# Patient Record
Sex: Female | Born: 1980 | Race: White | Hispanic: No | Marital: Married | Smoking: Never smoker
Health system: Southern US, Community
[De-identification: ages and names within clinical notes are randomized; demographics above are authoritative.]

## PROBLEM LIST (undated history)

## (undated) DIAGNOSIS — O139 Gestational [pregnancy-induced] hypertension without significant proteinuria, unspecified trimester: Secondary | ICD-10-CM

## (undated) DIAGNOSIS — Z87898 Personal history of other specified conditions: Secondary | ICD-10-CM

## (undated) HISTORY — DX: Gestational (pregnancy-induced) hypertension without significant proteinuria, unspecified trimester: O13.9

---

## 2005-07-19 ENCOUNTER — Emergency Department (HOSPITAL_COMMUNITY): Admission: EM | Admit: 2005-07-19 | Discharge: 2005-07-19 | Payer: Self-pay | Admitting: Emergency Medicine

## 2006-05-29 ENCOUNTER — Emergency Department (HOSPITAL_COMMUNITY): Admission: EM | Admit: 2006-05-29 | Discharge: 2006-05-29 | Payer: Self-pay | Admitting: Family Medicine

## 2008-07-23 ENCOUNTER — Ambulatory Visit (HOSPITAL_COMMUNITY): Admission: RE | Admit: 2008-07-23 | Discharge: 2008-07-23 | Payer: Self-pay | Admitting: Obstetrics and Gynecology

## 2009-02-12 ENCOUNTER — Ambulatory Visit: Payer: Self-pay | Admitting: Internal Medicine

## 2009-03-31 ENCOUNTER — Ambulatory Visit: Payer: Self-pay | Admitting: Internal Medicine

## 2009-05-26 ENCOUNTER — Inpatient Hospital Stay (HOSPITAL_COMMUNITY): Admission: AD | Admit: 2009-05-26 | Discharge: 2009-05-28 | Payer: Self-pay | Admitting: Obstetrics and Gynecology

## 2010-05-02 NOTE — L&D Delivery Note (Signed)
Delivery Note At 8:39 AM a viable and healthy female was delivered via Vaginal, Spontaneous Delivery (Presentation: Right Occiput Anterior).  APGAR: 9, 9; weight .   Placenta status: Intact, Spontaneous.  Cord: 3VC with the following complications: None.  Cord pH: na  Anesthesia: Epidural  Episiotomy: None Lacerations: 2nd degree Suture Repair: 2.0 vicryl rapide Est. Blood Loss (mL): 300  Mom to postpartum.  Baby to nursery-stable.  Amiyah Shryock J 04/20/2011, 8:53 AM

## 2010-05-23 ENCOUNTER — Encounter: Payer: Self-pay | Admitting: Obstetrics & Gynecology

## 2010-07-18 LAB — CBC
HCT: 41.4 % (ref 36.0–46.0)
Hemoglobin: 13.8 g/dL (ref 12.0–15.0)
MCHC: 33.3 g/dL (ref 30.0–36.0)
MCV: 98.9 fL (ref 78.0–100.0)
MCV: 99.2 fL (ref 78.0–100.0)
Platelets: 179 10*3/uL (ref 150–400)
RBC: 4.17 MIL/uL (ref 3.87–5.11)
RDW: 13.8 % (ref 11.5–15.5)
WBC: 19.2 10*3/uL — ABNORMAL HIGH (ref 4.0–10.5)

## 2010-07-18 LAB — COMPREHENSIVE METABOLIC PANEL
AST: 26 U/L (ref 0–37)
BUN: 7 mg/dL (ref 6–23)
CO2: 23 mEq/L (ref 19–32)
Chloride: 103 mEq/L (ref 96–112)
Creatinine, Ser: 0.59 mg/dL (ref 0.4–1.2)
GFR calc non Af Amer: >60 mL/min (ref >60)
Total Bilirubin: 0.8 mg/dL (ref 0.3–1.2)

## 2010-07-18 LAB — URIC ACID: Uric Acid, Serum: 4.4 mg/dL (ref 2.4–7.0)

## 2010-09-22 LAB — ABO/RH: RH Type: POSITIVE

## 2010-09-22 LAB — VARICELLA ZOSTER ANTIBODY, IGG: Varicella: IMMUNE

## 2010-09-22 LAB — ANTIBODY SCREEN: Antibody Screen: NEGATIVE

## 2011-04-18 ENCOUNTER — Telehealth (HOSPITAL_COMMUNITY): Payer: Self-pay | Admitting: *Deleted

## 2011-04-18 ENCOUNTER — Encounter (HOSPITAL_COMMUNITY): Payer: Self-pay | Admitting: *Deleted

## 2011-04-18 NOTE — Telephone Encounter (Signed)
Preadmission screen  

## 2011-04-20 ENCOUNTER — Encounter (HOSPITAL_COMMUNITY): Payer: Self-pay | Admitting: *Deleted

## 2011-04-20 ENCOUNTER — Encounter (HOSPITAL_COMMUNITY): Payer: Self-pay | Admitting: Anesthesiology

## 2011-04-20 ENCOUNTER — Inpatient Hospital Stay (HOSPITAL_COMMUNITY): Payer: BC Managed Care – PPO | Admitting: Anesthesiology

## 2011-04-20 ENCOUNTER — Inpatient Hospital Stay (HOSPITAL_COMMUNITY)
Admission: AD | Admit: 2011-04-20 | Discharge: 2011-04-22 | DRG: 373 | Disposition: A | Discharge status: Home / Self Care | Payer: BC Managed Care – PPO | Source: Ambulatory Visit | Attending: Obstetrics | Admitting: Obstetrics

## 2011-04-20 DIAGNOSIS — O480 Post-term pregnancy: Secondary | ICD-10-CM | POA: Diagnosis present

## 2011-04-20 DIAGNOSIS — D62 Acute posthemorrhagic anemia: Secondary | ICD-10-CM | POA: Diagnosis not present

## 2011-04-20 DIAGNOSIS — Z22330 Carrier of Group B streptococcus: Secondary | ICD-10-CM

## 2011-04-20 DIAGNOSIS — O9903 Anemia complicating the puerperium: Secondary | ICD-10-CM | POA: Diagnosis not present

## 2011-04-20 DIAGNOSIS — O99892 Other specified diseases and conditions complicating childbirth: Secondary | ICD-10-CM | POA: Diagnosis present

## 2011-04-20 LAB — CBC
HCT: 38.1 % (ref 36.0–46.0)
Hemoglobin: 13 g/dL (ref 12.0–15.0)
MCH: 32.3 pg (ref 26.0–34.0)
MCHC: 34.1 g/dL (ref 30.0–36.0)
MCV: 94.8 fL (ref 78.0–100.0)

## 2011-04-20 LAB — COMPREHENSIVE METABOLIC PANEL
BUN: 14 mg/dL (ref 6–23)
CO2: 22 mEq/L (ref 19–32)
Calcium: 9.2 mg/dL (ref 8.4–10.5)
Creatinine, Ser: 0.66 mg/dL (ref 0.50–1.10)
GFR calc Af Amer: >90 mL/min (ref >90)
GFR calc non Af Amer: >90 mL/min (ref >90)
Glucose, Bld: 95 mg/dL (ref 70–99)

## 2011-04-20 LAB — URIC ACID: Uric Acid, Serum: 4.4 mg/dL (ref 2.4–7.0)

## 2011-04-20 LAB — RPR: RPR Ser Ql: NONREACTIVE

## 2011-04-20 LAB — LACTATE DEHYDROGENASE: LDH: 223 U/L (ref 94–250)

## 2011-04-20 MED ORDER — EPHEDRINE 5 MG/ML INJ: 10.0000 mg | INTRAVENOUS | Status: DC | PRN

## 2011-04-20 MED ORDER — DIPHENHYDRAMINE HCL 50 MG/ML IJ SOLN: 12.5000 mg | INTRAMUSCULAR | Status: DC | PRN

## 2011-04-20 MED ORDER — DIBUCAINE 1 % RE OINT: 1.0000 "application " | TOPICAL_OINTMENT | RECTAL | Status: DC | PRN

## 2011-04-20 MED ORDER — ACETAMINOPHEN 325 MG PO TABS: 650.0000 mg | ORAL_TABLET | ORAL | Status: DC | PRN

## 2011-04-20 MED ORDER — TETANUS-DIPHTH-ACELL PERTUSSIS 5-2.5-18.5 LF-MCG/0.5 IM SUSP
0.5000 mL | Freq: Once | INTRAMUSCULAR | Status: AC
  Administered 2011-04-21: 0.5 mL via INTRAMUSCULAR
  Filled 2011-04-20: qty 0.5

## 2011-04-20 MED ORDER — BENZOCAINE-MENTHOL 20-0.5 % EX AERO: 1.0000 "application " | INHALATION_SPRAY | CUTANEOUS | Status: DC | PRN

## 2011-04-20 MED ORDER — FENTANYL 2.5 MCG/ML BUPIVACAINE 1/10 % EPIDURAL INFUSION (WH - ANES)
INTRAMUSCULAR | Status: DC | PRN
  Administered 2011-04-20: 14 mL/h via EPIDURAL

## 2011-04-20 MED ORDER — FLEET ENEMA 7-19 GM/118ML RE ENEM: 1.0000 | ENEMA | RECTAL | Status: DC | PRN

## 2011-04-20 MED ORDER — ONDANSETRON HCL 4 MG/2ML IJ SOLN
4.0000 mg | Freq: Four times a day (QID) | INTRAMUSCULAR | Status: DC | PRN
  Administered 2011-04-20: 4 mg via INTRAVENOUS
  Filled 2011-04-20: qty 2

## 2011-04-20 MED ORDER — ONDANSETRON HCL 4 MG/2ML IJ SOLN
4.0000 mg | INTRAMUSCULAR | Status: DC | PRN
  Filled 2011-04-20: qty 2

## 2011-04-20 MED ORDER — WITCH HAZEL-GLYCERIN EX PADS: 1.0000 "application " | MEDICATED_PAD | CUTANEOUS | Status: DC | PRN

## 2011-04-20 MED ORDER — PHENYLEPHRINE 40 MCG/ML (10ML) SYRINGE FOR IV PUSH (FOR BLOOD PRESSURE SUPPORT)
80.0000 ug | PREFILLED_SYRINGE | INTRAVENOUS | Status: DC | PRN
  Filled 2011-04-20: qty 5

## 2011-04-20 MED ORDER — METHYLERGONOVINE MALEATE 0.2 MG/ML IJ SOLN: 0.2000 mg | INTRAMUSCULAR | Status: DC | PRN

## 2011-04-20 MED ORDER — BENZOCAINE-MENTHOL 20-0.5 % EX AERO
INHALATION_SPRAY | CUTANEOUS | Status: AC
  Filled 2011-04-20: qty 56

## 2011-04-20 MED ORDER — LACTATED RINGERS IV SOLN: 500.0000 mL | Freq: Once | INTRAVENOUS | Status: DC

## 2011-04-20 MED ORDER — EPHEDRINE 5 MG/ML INJ
10.0000 mg | INTRAVENOUS | Status: DC | PRN
  Filled 2011-04-20: qty 4

## 2011-04-20 MED ORDER — METHYLERGONOVINE MALEATE 0.2 MG PO TABS: 0.2000 mg | ORAL_TABLET | ORAL | Status: DC | PRN

## 2011-04-20 MED ORDER — CITRIC ACID-SODIUM CITRATE 334-500 MG/5ML PO SOLN: 30.0000 mL | ORAL | Status: DC | PRN

## 2011-04-20 MED ORDER — DIPHENHYDRAMINE HCL 25 MG PO CAPS: 25.0000 mg | ORAL_CAPSULE | Freq: Four times a day (QID) | ORAL | Status: DC | PRN

## 2011-04-20 MED ORDER — LANOLIN HYDROUS EX OINT: TOPICAL_OINTMENT | CUTANEOUS | Status: DC | PRN

## 2011-04-20 MED ORDER — PRENATAL MULTIVITAMIN CH
1.0000 | ORAL_TABLET | Freq: Every day | ORAL | Status: DC
  Administered 2011-04-21 – 2011-04-22 (×2): 1 via ORAL
  Filled 2011-04-20 (×2): qty 1

## 2011-04-20 MED ORDER — FENTANYL 2.5 MCG/ML BUPIVACAINE 1/10 % EPIDURAL INFUSION (WH - ANES)
14.0000 mL/h | INTRAMUSCULAR | Status: DC
  Filled 2011-04-20: qty 60

## 2011-04-20 MED ORDER — LACTATED RINGERS IV SOLN
INTRAVENOUS | Status: DC
  Administered 2011-04-20: 05:00:00 via INTRAVENOUS

## 2011-04-20 MED ORDER — LIDOCAINE HCL 1.5 % IJ SOLN
INTRAMUSCULAR | Status: DC | PRN
  Administered 2011-04-20 (×2): 5 mL via EPIDURAL

## 2011-04-20 MED ORDER — OXYTOCIN 20 UNITS IN LACTATED RINGERS INFUSION - SIMPLE: 125.0000 mL/h | INTRAVENOUS | Status: DC

## 2011-04-20 MED ORDER — OXYTOCIN BOLUS FROM INFUSION
500.0000 mL | Freq: Once | INTRAVENOUS | Status: DC
  Filled 2011-04-20: qty 500
  Filled 2011-04-20: qty 1000

## 2011-04-20 MED ORDER — LACTATED RINGERS IV SOLN: 500.0000 mL | INTRAVENOUS | Status: DC | PRN

## 2011-04-20 MED ORDER — OXYCODONE-ACETAMINOPHEN 5-325 MG PO TABS: 2.0000 | ORAL_TABLET | ORAL | Status: DC | PRN

## 2011-04-20 MED ORDER — IBUPROFEN 600 MG PO TABS
600.0000 mg | ORAL_TABLET | Freq: Four times a day (QID) | ORAL | Status: DC
  Administered 2011-04-20 – 2011-04-22 (×8): 600 mg via ORAL
  Filled 2011-04-20 (×8): qty 1

## 2011-04-20 MED ORDER — LIDOCAINE HCL (PF) 1 % IJ SOLN
30.0000 mL | INTRAMUSCULAR | Status: DC | PRN
  Filled 2011-04-20: qty 30

## 2011-04-20 MED ORDER — PHENYLEPHRINE 40 MCG/ML (10ML) SYRINGE FOR IV PUSH (FOR BLOOD PRESSURE SUPPORT): 80.0000 ug | PREFILLED_SYRINGE | INTRAVENOUS | Status: DC | PRN

## 2011-04-20 MED ORDER — SENNOSIDES-DOCUSATE SODIUM 8.6-50 MG PO TABS
2.0000 | ORAL_TABLET | Freq: Every day | ORAL | Status: DC
  Administered 2011-04-20 – 2011-04-21 (×2): 2 via ORAL

## 2011-04-20 MED ORDER — ONDANSETRON HCL 4 MG PO TABS: 4.0000 mg | ORAL_TABLET | ORAL | Status: DC | PRN

## 2011-04-20 MED ORDER — IBUPROFEN 600 MG PO TABS: 600.0000 mg | ORAL_TABLET | Freq: Four times a day (QID) | ORAL | Status: DC | PRN

## 2011-04-20 MED ORDER — OXYTOCIN 20 UNITS IN LACTATED RINGERS INFUSION - SIMPLE
125.0000 mL/h | Freq: Once | INTRAVENOUS | Status: AC
  Administered 2011-04-20: 999 mL/h via INTRAVENOUS

## 2011-04-20 MED ORDER — SODIUM CHLORIDE 0.9 % IV SOLN
2.0000 g | Freq: Once | INTRAVENOUS | Status: AC
  Administered 2011-04-20: 2 g via INTRAVENOUS
  Filled 2011-04-20: qty 2000

## 2011-04-20 MED ORDER — SIMETHICONE 80 MG PO CHEW: 80.0000 mg | CHEWABLE_TABLET | ORAL | Status: DC | PRN

## 2011-04-20 MED ORDER — OXYCODONE-ACETAMINOPHEN 5-325 MG PO TABS: 1.0000 | ORAL_TABLET | ORAL | Status: DC | PRN

## 2011-04-20 MED ORDER — ZOLPIDEM TARTRATE 5 MG PO TABS: 5.0000 mg | ORAL_TABLET | Freq: Every evening | ORAL | Status: DC | PRN

## 2011-04-20 NOTE — Anesthesia Procedure Notes (Signed)
Epidural Patient location during procedure: OB Start time: 04/20/2011 6:19 AM End time: 04/20/2011 6:24 AM Reason for block: procedure for pain  Staffing Anesthesiologist: Sandrea Hughs  Preanesthetic Checklist Completed: patient identified, site marked, surgical consent, pre-op evaluation, timeout performed, IV checked, risks and benefits discussed and monitors and equipment checked  Epidural Patient position: sitting Prep: site prepped and draped and DuraPrep Patient monitoring: continuous pulse ox and blood pressure Approach: midline Injection technique: LOR air  Needle:  Needle type: Tuohy  Needle gauge: 17 G Needle length: 9 cm Needle insertion depth: 5 cm cm Catheter type: closed end flexible Catheter size: 19 Gauge Catheter at skin depth: 10 cm Test dose: negative and 1.5% lidocaine  Assessment Sensory level: T8 Events: blood not aspirated, injection not painful, no injection resistance, negative IV test and no paresthesia

## 2011-04-20 NOTE — Anesthesia Postprocedure Evaluation (Signed)
  Anesthesia Post Note  Patient: Mia Nguyen  Procedure(s) Performed: * No procedures listed *  Anesthesia type: Epidural  Patient location: Mother/Baby  Post pain: Pain level controlled  Post assessment: Post-op Vital signs reviewed  Last Vitals:  Filed Vitals:   04/20/11 1500  BP: 108/69  Pulse: 109  Temp: 36.9 C  Resp: 16    Post vital signs: Reviewed  Level of consciousness:alert  Complications: No apparent anesthesia complications

## 2011-04-20 NOTE — Progress Notes (Signed)
Contractions  

## 2011-04-20 NOTE — Progress Notes (Signed)
Nursing note:  Dr. Ernestina Penna notified of patient in [address] having heavy bleeding and large clots upon fundal massage at 1310. Dr. Ernestina Penna notified of blood pressure after fundal massage of 91\65 and pluse of 96.Dr. Ernestina Penna was also notified of Blood pressure before the blood clots of 114\80 pulse 120. No new orders received at that time. We will monitor patient. Patient has voided 200cc via bedpan. Fundal massage done after Patient voided and there were no blood clots. Patient was firm and 2 below umbilicus. Patient has good color and is resting comfortably now. Report given to RN Limmie Patricia. We will continue to monitor patient.

## 2011-04-20 NOTE — Progress Notes (Signed)
Pt may go to room 174. 

## 2011-04-20 NOTE — Addendum Note (Signed)
Addendum  created 04/20/11 1712 by Cephus Shelling   Modules edited:Charges VN, Notes Section

## 2011-04-20 NOTE — Progress Notes (Signed)
Pt complained of syncope and not feeling good. I did a fundal massage and pt had many large golf ball like clots. Lochia heavy. Fundus was down 2 and firm no more clots noted. Bladder scan was 317cc. Patient was cleaned up and left to rest.

## 2011-04-20 NOTE — Progress Notes (Signed)
Mia Nguyen is a 30 y.o. G2P1001 at [redacted]w[redacted]d by LMP admitted for active labor  Subjective: Feels pressure  Objective: BP 134/74  Pulse 68  Temp(Src) 97.5 F (36.4 C) (Oral)  Resp 20  Ht 5\' 4"  (1.626 m)  Wt 63.504 kg (140 lb)  BMI 24.03 kg/m2  SpO2 98%  LMP 07/08/2010      FHT:  FHR: 155 bpm, variability: moderate,  accelerations:  Present,  decelerations:  Absent UC:   regular, every 3-4 minutes SVE:   Dilation: 10 Effacement (%): 100 Station: 0 Exam by:: Johanan Skorupski AROM- clear  Labs: Lab Results  Component Value Date   WBC 14.1* 04/20/2011   HGB 13.0 04/20/2011   HCT 38.1 04/20/2011   MCV 94.8 04/20/2011   PLT 178 04/20/2011    Assessment / Plan: Spontaneous labor, progressing normally GBS positive received Ampicillin Elevated BP, nl labs, no s/s of PEC  Labor: Progressing normally Preeclampsia:  no signs or symptoms of toxicity, intake and ouput balanced and labs stable Fetal Wellbeing:  Category I Pain Control:  Epidural I/D:  n/a Anticipated MOD:  NSVD  Kwame Ryland J 04/20/2011, 8:13 AM

## 2011-04-20 NOTE — Addendum Note (Signed)
Addendum  created 04/20/11 1712 by Marty Sadlowski   Modules edited:Charges VN, Notes Section    

## 2011-04-20 NOTE — Anesthesia Postprocedure Evaluation (Signed)
Anesthesia Post Note  Patient: Mia Nguyen  Procedure(s) Performed: * No procedures listed *  Anesthesia type: Epidural  Patient location: Mother/Baby  Post pain: Pain level controlled  Post assessment: Post-op Vital signs reviewed  Last Vitals:  Filed Vitals:   04/20/11 0916  BP: 144/83  Pulse: 65  Temp:   Resp: 20    Post vital signs: Reviewed  Level of consciousness: awake  Complications: No apparent anesthesia complications

## 2011-04-20 NOTE — Progress Notes (Signed)
Dr. Billy Coast called back to add on [facility] labs.

## 2011-04-20 NOTE — Progress Notes (Signed)
No new clots noted or oozing. Pt color was good. Pt resting comfortably. Fundus firm and 2 below umbilicus. Will monitor bleeding.

## 2011-04-20 NOTE — Progress Notes (Signed)
Notified of pt presenting for labor check.  Notified of VE and ctx pattern.  Notified of elevated BP.  Admit orders received.

## 2011-04-20 NOTE — Anesthesia Preprocedure Evaluation (Signed)
Anesthesia Evaluation  Patient identified by MRN, date of birth, ID band Patient awake    Reviewed: Allergy & Precautions, H&P , NPO status , Patient's Chart, lab work & pertinent test results  Airway Mallampati: I TM Distance: >3 FB Neck ROM: full    Dental No notable dental hx.    Pulmonary neg pulmonary ROS,    Pulmonary exam normal       Cardiovascular     Neuro/Psych Negative Neurological ROS  Negative Psych ROS   GI/Hepatic negative GI ROS, Neg liver ROS,   Endo/Other  Negative Endocrine ROS  Renal/GU negative Renal ROS  Genitourinary negative   Musculoskeletal negative musculoskeletal ROS (+)   Abdominal Normal abdominal exam  (+)   Peds negative pediatric ROS (+)  Hematology negative hematology ROS (+)   Anesthesia Other Findings   Reproductive/Obstetrics (+) Pregnancy                           Anesthesia Physical Anesthesia Plan  ASA: II  Anesthesia Plan: Epidural   Post-op Pain Management:    Induction:   Airway Management Planned:   Additional Equipment:   Intra-op Plan:   Post-operative Plan:   Informed Consent: I have reviewed the patients History and Physical, chart, labs and discussed the procedure including the risks, benefits and alternatives for the proposed anesthesia with the patient or authorized representative who has indicated his/her understanding and acceptance.     Plan Discussed with:   Anesthesia Plan Comments:         Anesthesia Quick Evaluation  

## 2011-04-21 LAB — CBC
HCT: 23.2 % — ABNORMAL LOW (ref 36.0–46.0)
Hemoglobin: 7.8 g/dL — ABNORMAL LOW (ref 12.0–15.0)
MCHC: 33.6 g/dL (ref 30.0–36.0)
WBC: 13.9 10*3/uL — ABNORMAL HIGH (ref 4.0–10.5)

## 2011-04-21 MED ORDER — POLYSACCHARIDE IRON 150 MG PO CAPS
150.0000 mg | ORAL_CAPSULE | Freq: Two times a day (BID) | ORAL | Status: DC
  Administered 2011-04-21 – 2011-04-22 (×3): 150 mg via ORAL
  Filled 2011-04-21 (×3): qty 1

## 2011-04-21 MED ORDER — DOCUSATE SODIUM 100 MG PO CAPS
100.0000 mg | ORAL_CAPSULE | Freq: Two times a day (BID) | ORAL | Status: DC
  Administered 2011-04-21 – 2011-04-22 (×3): 100 mg via ORAL
  Filled 2011-04-21 (×3): qty 1

## 2011-04-21 NOTE — H&P (Signed)
Mia Nguyen, Mia Nguyen                   ACCOUNT NO.:  [id number]  MEDICAL RECORD NO.:  [id number]  LOCATION:  9112                          FACILITY:  [facility]  PHYSICIAN:  Lenoard Aden, M.D.DATE OF BIRTH:  [birth year]  DATE OF ADMISSION:  04/20/2011 DATE OF DISCHARGE:                             HISTORY & PHYSICAL   CHIEF COMPLAINT:  Labor.  HISTORY OF PRESENT ILLNESS:  She is a 30 year old white female, G2, P1, at 23 weeks' and 6 days gestation who presents for active labor.  She reports contractions every 4 minutes.  She has allergies to SULFA.  Medications include prenatal vitamins.  She is a nonsmoker, nondrinker.  She denies domestic or physical violence.  She has a family history of stroke, hypertension, prostate, and breast cancer.  She has a previous history of an uncomplicated vaginal delivery.  Prenatal course complicated by group B strep positivity and postdate status.  PHYSICAL EXAMINATION:  GENERAL:  She is a well-developed, well- nourished, white female, in no acute distress. HEENT:  Normal. NECK:  Supple.  Full range of motion. LUNGS:  Clear. HEART:  Regular rhythm. ABDOMEN:  Soft, gravid, nontender.  Estimated fetal weight 7.5-8 pounds. Cervix is 9-10 cm, 100% vertex, 0 station. EXTREMITIES:  There are no cords. NEUROLOGIC:  Nonfocal. SKIN:  Intact.  IMPRESSION: 1. Term intrauterine pregnancy in active labor. 2. GBS positive.  PLAN:  Ampicillin prophylaxis, epidural anticipate attempts at vaginal delivery.     Lenoard Aden, M.D.     RJT/MEDQ  D:  04/20/2011  T:  04/21/2011  Job:  [id number]

## 2011-04-21 NOTE — Progress Notes (Signed)
PPD 1 SVD  S:  Reports feeling well, slightly tired.             Tolerating po/ No nausea or vomiting             Bleeding is light now, episode of heavy bleeding and clots yesterday, resolved after fundal massage.             Pain controlled withmotrin             Up ad lib / ambulatory, showered this AM, denies dizziness/SOB/CP  Newborn  Information for the patient's newborn:  Baylynn, Shifflett [[id number]]  female  breast feeding    O:  A & O x 3 NAD             VS: Blood pressure 105/65, pulse 77, temperature 97.7 F (36.5 C), temperature source Oral, resp. rate 18, height 5\' 4"  (1.626 m), weight 63.504 kg (140 lb), last menstrual period 07/08/2010, SpO2 98.00%, unknown if currently breastfeeding.  LABS:  Basename 04/21/11 0520 04/20/11 0520  HGB 7.8* 13.0  HCT 23.2* 38.1    I&O: I/O last 3 completed shifts: In: -  Out: 900 [Urine:600; Blood:300]      Lungs: Clear and unlabored  Heart: regular rate and rhythm / no mumurs  Abdomen: soft, non-tender, non-distended              Fundus: firm, non-tender, U-1  Perineum: intact repair, no edema  Lochia: scant  Extremities: no edema, no calf pain or tenderness, neg Homans    A/P: PPD # 1 30 y.o., [id number] S/P:spontaneous vaginal   Active Problems:  Postpartum care following vaginal delivery (12/19) PP hemorrhage / uterine atony - resolved  Acute Blood loss anemia - asymptomatic   Routine PP orders Start oral Fe and Colace Anticipate discharge home in AM.    Klinton Candelas, CNM, MSN 04/21/2011, 10:03 AM

## 2011-04-22 ENCOUNTER — Inpatient Hospital Stay (HOSPITAL_COMMUNITY): Admission: RE | Admit: 2011-04-22 | Payer: Self-pay | Source: Ambulatory Visit

## 2011-04-22 MED ORDER — IBUPROFEN 600 MG PO TABS: 600.0000 mg | ORAL_TABLET | Freq: Four times a day (QID) | ORAL | Status: AC

## 2011-04-22 MED ORDER — OXYCODONE-ACETAMINOPHEN 5-325 MG PO TABS
1.0000 | ORAL_TABLET | Freq: Four times a day (QID) | ORAL | Status: AC | PRN
Start: 2011-04-22 — End: 2011-05-02

## 2011-04-22 MED ORDER — POLYSACCHARIDE IRON 150 MG PO CAPS: 150.0000 mg | ORAL_CAPSULE | Freq: Two times a day (BID) | ORAL | Status: DC

## 2011-04-22 NOTE — Progress Notes (Signed)
Patient ID: Mia Nguyen, female   DOB: 04-20-1981, 30 y.o.   MRN: [id number] PPD # 2  Subjective: Pt reports feeling well and eager for d/c home/ Pain controlled with prescription NSAID's including motrin and narcotic analgesics including percocet Tolerating po/ Voiding without problems/ No n/v Bleeding is light/ Newborn info:  Information for the patient's newborn:  Ming, Kunka [[id number]]  female  Feeding: breast    Objective:  VS: Blood pressure 126/75, pulse 88, temperature 97.8 F (36.6 C), temperature source Oral, resp. rate 18, height 5\' 4"  (1.626 m), weight 63.504 kg (140 lb), last menstrual period 07/08/2010, SpO2 98.00%, unknown if currently breastfeeding.    Basename 04/21/11 0520 04/20/11 0520  WBC 13.9* 14.1*  HGB 7.8* 13.0  HCT 23.2* 38.1  PLT 135* 178    Blood type: B/Positive/-- (05/23 0000) Rubella:      Physical Exam:  General: alert, cooperative and no distress Abdomen: soft, nontender, normal bowel sounds Uterine Fundus: firm, below umbilicus, nontender Perineum: not inspected Lochia: minimal Ext: Homans sign is negative, no sign of DVT and no edema, redness or tenderness in the calves or thighs   A/P: PPD # 2/ [id number] ABL Anemia Doing well and stable for discharge home RX: Ibuprofen 600mg  po Q 6 hrs prn pain #30 Refill x 1 Niferex 150mg  po BID #60 Ref x 2 Percocet 5/325 1 to 2 po Q 4 hrs prn pain #15 No refill WOB/GYN booklet given Routine pp visit in 6wks  Signed: Arlana Lindau, [facility] 04/22/11 1000

## 2011-04-22 NOTE — Discharge Summary (Signed)
Obstetric Discharge Summary Reason for Admission: onset of labor and Term Gestation at 40 wks 6 days Prenatal Procedures: NST and ultrasound Intrapartum Procedures: spontaneous vaginal delivery and 2nd degree repair Postpartum Procedures: none Complications-Operative and Postpartum: none Hemoglobin  Date Value Range Status  04/21/2011 7.8* 12.0-15.0 (g/dL) Final     DELTA CHECK NOTED     REPEATED TO VERIFY     HCT  Date Value Range Status  04/21/2011 23.2* 36.0-46.0 (%) Final    Discharge Diagnoses: Term Pregnancy-delivered and ABL anemia  Discharge Information: Date: 04/22/2011 Activity: pelvic rest Diet: routine Medications: Ibuprofen and Iron Condition: stable Instructions: refer to practice specific booklet Discharge to: home   Newborn Data: Live born female on 04/20/11 Birth Weight: 7 lb 8.1 oz (3405 g) APGAR: 9, 9  Home with mother.  Mia Nguyen 04/22/2011, 9:32 AM

## 2012-04-27 ENCOUNTER — Telehealth: Payer: Self-pay | Admitting: Internal Medicine

## 2012-04-27 NOTE — Telephone Encounter (Signed)
Patient has only been seen here once October 2010. Requesting Tamiflu for flu exposure. Does not have fever or shaking chills only slight cough. [place] is in [place]. 4 family members apparently had flu symptoms. She is asking that Tamiflu be called in for her. We have declined to do this. She will not return home and will December 30. We can see her then or she can go to urgent care. We would not prescribe Tamiflu and that she had true flulike symptoms such as  fever, chills, myalgias

## 2012-04-30 ENCOUNTER — Encounter: Payer: Self-pay | Admitting: Internal Medicine

## 2012-04-30 ENCOUNTER — Ambulatory Visit (INDEPENDENT_AMBULATORY_CARE_PROVIDER_SITE_OTHER): Payer: BC Managed Care – PPO | Admitting: Internal Medicine

## 2012-04-30 VITALS — BP 114/80 | Temp 98.4°F | Ht 63.0 in | Wt 120.0 lb

## 2012-04-30 DIAGNOSIS — H6593 Unspecified nonsuppurative otitis media, bilateral: Secondary | ICD-10-CM

## 2012-04-30 DIAGNOSIS — B349 Viral infection, unspecified: Secondary | ICD-10-CM

## 2012-04-30 DIAGNOSIS — Z23 Encounter for immunization: Secondary | ICD-10-CM

## 2012-04-30 DIAGNOSIS — J029 Acute pharyngitis, unspecified: Secondary | ICD-10-CM

## 2012-04-30 DIAGNOSIS — B9789 Other viral agents as the cause of diseases classified elsewhere: Secondary | ICD-10-CM

## 2012-04-30 DIAGNOSIS — J40 Bronchitis, not specified as acute or chronic: Secondary | ICD-10-CM

## 2012-04-30 DIAGNOSIS — H6590 Unspecified nonsuppurative otitis media, unspecified ear: Secondary | ICD-10-CM

## 2012-04-30 NOTE — Patient Instructions (Addendum)
Influenza immunization given today. Take Zithromax Z-PAK as corrected. Take Tessalon Perles as needed for cough. Call if not better in one week. Tylenol if needed for myalgias or fever. Consider yourself contagious for the next 48 hours.

## 2012-04-30 NOTE — Progress Notes (Signed)
  Subjective:    Patient ID: Mia Nguyen, female    DOB: [birth year], 31 y.o.   MRN: [id number]  HPI 31 year old white female not seen in 4 years in today with onset of febrile illness on Friday, December 27 while out of town. Other family members had similar illness. She had myalgias, developed a scratchy throat with cough. Cough is now productive of discolored sputum. Her ears feel full and stuffy particularly the left one. She had a couple of episodes of vomiting but no diarrhea. Did have some chills.    Review of Systems     Objective:   Physical Exam HEENT exam: Both TMs are full but the left one is fuller than the right one. TMs are not red. Pharynx is red. Rapid strep screen is negative. Neck is supple. Chest clear to auscultation.        Assessment & Plan:  Bilateral serous otitis media  Pharyngitis  Bronchitis  Viral syndrome  Plan: Explained to patient is too late to start Tamiflu. She did not want to have flu PCR testing. Zithromax Z-PAK take 2 tablets day one followed by 1 tablet days 2 through 5. Call if not better in one week. Tessalon Perles 100 mg per and sees #60) 2 by mouth 3 times a day when necessary cough with one refill. Influenza immunization given today.

## 2013-01-03 LAB — OB RESULTS CONSOLE HIV ANTIBODY (ROUTINE TESTING): HIV: NONREACTIVE

## 2013-01-03 LAB — OB RESULTS CONSOLE ANTIBODY SCREEN: ANTIBODY SCREEN: NEGATIVE

## 2013-01-03 LAB — OB RESULTS CONSOLE RUBELLA ANTIBODY, IGM: Rubella: IMMUNE

## 2013-01-03 LAB — OB RESULTS CONSOLE HEPATITIS B SURFACE ANTIGEN: Hepatitis B Surface Ag: NEGATIVE

## 2013-01-03 LAB — OB RESULTS CONSOLE ABO/RH: RH TYPE: POSITIVE

## 2013-01-03 LAB — OB RESULTS CONSOLE RPR: RPR: NONREACTIVE

## 2013-01-10 LAB — OB RESULTS CONSOLE GC/CHLAMYDIA
CHLAMYDIA, DNA PROBE: NEGATIVE
GC PROBE AMP, GENITAL: NEGATIVE

## 2013-04-11 DIAGNOSIS — O09899 Supervision of other high risk pregnancies, unspecified trimester: Secondary | ICD-10-CM

## 2013-04-11 HISTORY — DX: Supervision of other high risk pregnancies, unspecified trimester: O09.899

## 2013-05-02 NOTE — L&D Delivery Note (Signed)
Delivery Note At 3:57 PM a viable and healthy female was delivered via  (Presentation: LOA  ).  APGAR: 9,9 ; weight pending.   Placenta status: spontaneous, intact .  Cord:  with the following complications: none.  Cord pH: na Placenta to pathology  Anesthesia:  epidural Episiotomy: none Lacerations: second Suture Repair: 2.0 vicryl rapide Est. Blood Loss (mL): 200  Mom to postpartum.  Baby to Couplet care / Skin to Skin.  Elizabth Palka J 07/29/2013, 4:12 PM

## 2013-05-09 ENCOUNTER — Inpatient Hospital Stay (HOSPITAL_COMMUNITY)
Admission: AD | Admit: 2013-05-09 | Payer: BC Managed Care – PPO | Source: Ambulatory Visit | Admitting: Obstetrics and Gynecology

## 2013-07-01 LAB — OB RESULTS CONSOLE GBS: GBS: NEGATIVE

## 2013-07-16 ENCOUNTER — Other Ambulatory Visit: Payer: Self-pay | Admitting: Obstetrics and Gynecology

## 2013-07-24 ENCOUNTER — Encounter (HOSPITAL_COMMUNITY): Payer: Self-pay | Admitting: *Deleted

## 2013-07-24 ENCOUNTER — Telehealth (HOSPITAL_COMMUNITY): Payer: Self-pay | Admitting: *Deleted

## 2013-07-24 NOTE — Telephone Encounter (Signed)
Preadmission screen  

## 2013-07-29 ENCOUNTER — Encounter (HOSPITAL_COMMUNITY): Payer: BC Managed Care – PPO | Admitting: Anesthesiology

## 2013-07-29 ENCOUNTER — Inpatient Hospital Stay (HOSPITAL_COMMUNITY)
Admission: RE | Admit: 2013-07-29 | Discharge: 2013-07-31 | DRG: 774 | Disposition: A | Discharge status: Home / Self Care | Payer: BC Managed Care – PPO | Source: Ambulatory Visit | Attending: Obstetrics and Gynecology | Admitting: Obstetrics and Gynecology

## 2013-07-29 ENCOUNTER — Encounter (HOSPITAL_COMMUNITY): Payer: Self-pay

## 2013-07-29 ENCOUNTER — Inpatient Hospital Stay (HOSPITAL_COMMUNITY): Payer: BC Managed Care – PPO | Admitting: Anesthesiology

## 2013-07-29 DIAGNOSIS — K219 Gastro-esophageal reflux disease without esophagitis: Secondary | ICD-10-CM | POA: Diagnosis present

## 2013-07-29 DIAGNOSIS — Z823 Family history of stroke: Secondary | ICD-10-CM

## 2013-07-29 DIAGNOSIS — O1002 Pre-existing essential hypertension complicating childbirth: Secondary | ICD-10-CM | POA: Diagnosis present

## 2013-07-29 DIAGNOSIS — Z87898 Personal history of other specified conditions: Secondary | ICD-10-CM

## 2013-07-29 HISTORY — DX: Personal history of other specified conditions: Z87.898

## 2013-07-29 LAB — CBC
HEMATOCRIT: 38.7 % (ref 36.0–46.0)
HEMOGLOBIN: 13.4 g/dL (ref 12.0–15.0)
MCH: 32.6 pg (ref 26.0–34.0)
MCHC: 34.6 g/dL (ref 30.0–36.0)
MCV: 94.2 fL (ref 78.0–100.0)
Platelets: 201 10*3/uL (ref 150–400)
RBC: 4.11 MIL/uL (ref 3.87–5.11)
RDW: 13.1 % (ref 11.5–15.5)
WBC: 11.5 10*3/uL — ABNORMAL HIGH (ref 4.0–10.5)

## 2013-07-29 LAB — COMPREHENSIVE METABOLIC PANEL
ALBUMIN: 2.7 g/dL — AB (ref 3.5–5.2)
ALT: 12 U/L (ref 0–35)
AST: 20 U/L (ref 0–37)
Alkaline Phosphatase: 124 U/L — ABNORMAL HIGH (ref 39–117)
BUN: 14 mg/dL (ref 6–23)
CALCIUM: 9.4 mg/dL (ref 8.4–10.5)
CHLORIDE: 104 meq/L (ref 96–112)
CO2: 22 mEq/L (ref 19–32)
CREATININE: 0.54 mg/dL (ref 0.50–1.10)
GFR calc Af Amer: >90 mL/min (ref >90)
GFR calc non Af Amer: >90 mL/min (ref >90)
Glucose, Bld: 95 mg/dL (ref 70–99)
Potassium: 4.2 mEq/L (ref 3.7–5.3)
Sodium: 137 mEq/L (ref 137–147)
TOTAL PROTEIN: 5.8 g/dL — AB (ref 6.0–8.3)
Total Bilirubin: 0.3 mg/dL (ref 0.3–1.2)

## 2013-07-29 LAB — TYPE AND SCREEN
ABO/RH(D): B POS
ANTIBODY SCREEN: NEGATIVE

## 2013-07-29 LAB — ABO/RH: ABO/RH(D): B POS

## 2013-07-29 LAB — URIC ACID: URIC ACID, SERUM: 3.9 mg/dL (ref 2.4–7.0)

## 2013-07-29 LAB — RPR: RPR Ser Ql: NONREACTIVE

## 2013-07-29 MED ORDER — PHENYLEPHRINE 40 MCG/ML (10ML) SYRINGE FOR IV PUSH (FOR BLOOD PRESSURE SUPPORT)
PREFILLED_SYRINGE | INTRAVENOUS | Status: AC
  Filled 2013-07-29: qty 10

## 2013-07-29 MED ORDER — FENTANYL 2.5 MCG/ML BUPIVACAINE 1/10 % EPIDURAL INFUSION (WH - ANES)
INTRAMUSCULAR | Status: DC | PRN
  Administered 2013-07-29: 13 mL/h via EPIDURAL

## 2013-07-29 MED ORDER — IBUPROFEN 600 MG PO TABS: 600.0000 mg | ORAL_TABLET | Freq: Four times a day (QID) | ORAL | Status: DC | PRN

## 2013-07-29 MED ORDER — LACTATED RINGERS IV SOLN
500.0000 mL | INTRAVENOUS | Status: DC | PRN
Start: 2013-07-29 — End: 2013-07-29

## 2013-07-29 MED ORDER — DIPHENHYDRAMINE HCL 25 MG PO CAPS: 25.0000 mg | ORAL_CAPSULE | Freq: Four times a day (QID) | ORAL | Status: DC | PRN

## 2013-07-29 MED ORDER — TERBUTALINE SULFATE 1 MG/ML IJ SOLN: 0.2500 mg | Freq: Once | INTRAMUSCULAR | Status: DC | PRN

## 2013-07-29 MED ORDER — EPHEDRINE 5 MG/ML INJ: 10.0000 mg | INTRAVENOUS | Status: DC | PRN

## 2013-07-29 MED ORDER — PHENYLEPHRINE 40 MCG/ML (10ML) SYRINGE FOR IV PUSH (FOR BLOOD PRESSURE SUPPORT): 80.0000 ug | PREFILLED_SYRINGE | INTRAVENOUS | Status: DC | PRN

## 2013-07-29 MED ORDER — LIDOCAINE HCL (PF) 1 % IJ SOLN
INTRAMUSCULAR | Status: DC | PRN
  Administered 2013-07-29 (×2): 4 mL

## 2013-07-29 MED ORDER — ACETAMINOPHEN 325 MG PO TABS
650.0000 mg | ORAL_TABLET | ORAL | Status: DC | PRN
  Administered 2013-07-29: 650 mg via ORAL
  Filled 2013-07-29: qty 2

## 2013-07-29 MED ORDER — SIMETHICONE 80 MG PO CHEW: 80.0000 mg | CHEWABLE_TABLET | ORAL | Status: DC | PRN

## 2013-07-29 MED ORDER — BENZOCAINE-MENTHOL 20-0.5 % EX AERO
1.0000 "application " | INHALATION_SPRAY | CUTANEOUS | Status: DC | PRN
  Administered 2013-07-29: 1 via TOPICAL
  Filled 2013-07-29 (×2): qty 56

## 2013-07-29 MED ORDER — DIBUCAINE 1 % RE OINT
1.0000 "application " | TOPICAL_OINTMENT | RECTAL | Status: DC | PRN
  Filled 2013-07-29: qty 28

## 2013-07-29 MED ORDER — OXYTOCIN BOLUS FROM INFUSION: 500.0000 mL | INTRAVENOUS | Status: DC

## 2013-07-29 MED ORDER — OXYTOCIN 40 UNITS IN LACTATED RINGERS INFUSION - SIMPLE MED
62.5000 mL/h | INTRAVENOUS | Status: DC
  Filled 2013-07-29: qty 1000

## 2013-07-29 MED ORDER — SENNOSIDES-DOCUSATE SODIUM 8.6-50 MG PO TABS
2.0000 | ORAL_TABLET | ORAL | Status: DC
  Administered 2013-07-29 – 2013-07-30 (×2): 2 via ORAL
  Filled 2013-07-29 (×2): qty 2

## 2013-07-29 MED ORDER — LACTATED RINGERS IV SOLN
INTRAVENOUS | Status: DC
  Administered 2013-07-29: 08:00:00 via INTRAVENOUS

## 2013-07-29 MED ORDER — LANOLIN HYDROUS EX OINT: TOPICAL_OINTMENT | CUTANEOUS | Status: DC | PRN

## 2013-07-29 MED ORDER — DIPHENHYDRAMINE HCL 50 MG/ML IJ SOLN: 12.5000 mg | INTRAMUSCULAR | Status: DC | PRN

## 2013-07-29 MED ORDER — LACTATED RINGERS IV SOLN: 500.0000 mL | Freq: Once | INTRAVENOUS | Status: DC

## 2013-07-29 MED ORDER — OXYCODONE-ACETAMINOPHEN 5-325 MG PO TABS: 1.0000 | ORAL_TABLET | ORAL | Status: DC | PRN

## 2013-07-29 MED ORDER — FENTANYL 2.5 MCG/ML BUPIVACAINE 1/10 % EPIDURAL INFUSION (WH - ANES): 14.0000 mL/h | INTRAMUSCULAR | Status: DC | PRN

## 2013-07-29 MED ORDER — ONDANSETRON HCL 4 MG/2ML IJ SOLN: 4.0000 mg | Freq: Four times a day (QID) | INTRAMUSCULAR | Status: DC | PRN

## 2013-07-29 MED ORDER — ONDANSETRON HCL 4 MG/2ML IJ SOLN: 4.0000 mg | INTRAMUSCULAR | Status: DC | PRN

## 2013-07-29 MED ORDER — METHYLERGONOVINE MALEATE 0.2 MG PO TABS: 0.2000 mg | ORAL_TABLET | ORAL | Status: DC | PRN

## 2013-07-29 MED ORDER — METHYLERGONOVINE MALEATE 0.2 MG/ML IJ SOLN: 0.2000 mg | INTRAMUSCULAR | Status: DC | PRN

## 2013-07-29 MED ORDER — ZOLPIDEM TARTRATE 5 MG PO TABS: 5.0000 mg | ORAL_TABLET | Freq: Every evening | ORAL | Status: DC | PRN

## 2013-07-29 MED ORDER — EPHEDRINE 5 MG/ML INJ
INTRAVENOUS | Status: AC
  Filled 2013-07-29: qty 4

## 2013-07-29 MED ORDER — LIDOCAINE HCL (PF) 1 % IJ SOLN: 30.0000 mL | INTRAMUSCULAR | Status: DC | PRN

## 2013-07-29 MED ORDER — TETANUS-DIPHTH-ACELL PERTUSSIS 5-2.5-18.5 LF-MCG/0.5 IM SUSP: 0.5000 mL | Freq: Once | INTRAMUSCULAR | Status: DC

## 2013-07-29 MED ORDER — CITRIC ACID-SODIUM CITRATE 334-500 MG/5ML PO SOLN: 30.0000 mL | ORAL | Status: DC | PRN

## 2013-07-29 MED ORDER — FENTANYL 2.5 MCG/ML BUPIVACAINE 1/10 % EPIDURAL INFUSION (WH - ANES)
INTRAMUSCULAR | Status: AC
  Filled 2013-07-29: qty 125

## 2013-07-29 MED ORDER — FLEET ENEMA 7-19 GM/118ML RE ENEM: 1.0000 | ENEMA | RECTAL | Status: DC | PRN

## 2013-07-29 MED ORDER — PRENATAL MULTIVITAMIN CH
1.0000 | ORAL_TABLET | Freq: Every day | ORAL | Status: DC
  Administered 2013-07-30: 1 via ORAL
  Filled 2013-07-29: qty 1

## 2013-07-29 MED ORDER — ONDANSETRON HCL 4 MG PO TABS: 4.0000 mg | ORAL_TABLET | ORAL | Status: DC | PRN

## 2013-07-29 MED ORDER — WITCH HAZEL-GLYCERIN EX PADS
1.0000 | MEDICATED_PAD | CUTANEOUS | Status: DC | PRN
Start: 2013-07-29 — End: 2013-07-31

## 2013-07-29 MED ORDER — OXYTOCIN 40 UNITS IN LACTATED RINGERS INFUSION - SIMPLE MED
1.0000 m[IU]/min | INTRAVENOUS | Status: DC
  Administered 2013-07-29: 2 m[IU]/min via INTRAVENOUS

## 2013-07-29 MED ORDER — IBUPROFEN 600 MG PO TABS
600.0000 mg | ORAL_TABLET | Freq: Four times a day (QID) | ORAL | Status: DC
  Administered 2013-07-29 – 2013-07-31 (×7): 600 mg via ORAL
  Filled 2013-07-29 (×7): qty 1

## 2013-07-29 NOTE — Progress Notes (Signed)
Mia Nguyen is a 33 y.o. G3P2002 at [redacted]w[redacted]d by LMP admitted for induction of labor due to SUA.  Subjective: comfortable  Objective: BP 129/78  Pulse 75  Ht 5\' 3"  (1.6 m)  Wt 66.679 kg (147 lb)  BMI 26.05 kg/m2  SpO2 99%  LMP 10/29/2012      FHT:  FHR: 145 bpm, variability: moderate,  accelerations:  Present,  decelerations:  Absent UC:   regular, every 2 minutes SVE:   Dilation: 4 Effacement (%): 80 Station: -1 Exam by:: m wilkins rnc  Labs: Lab Results  Component Value Date   WBC 11.5* 07/29/2013   HGB 13.4 07/29/2013   HCT 38.7 07/29/2013   MCV 94.2 07/29/2013   PLT 201 07/29/2013    Assessment / Plan: Induction of labor due to SUA,  progressing well on pitocin  Labor: Progressing normally Preeclampsia:  no signs or symptoms of toxicity Fetal Wellbeing:  Category I Pain Control:  Epidural I/D:  n/a Anticipated MOD:  NSVD  Mia Nguyen J 07/29/2013, 1:08 PM

## 2013-07-29 NOTE — Anesthesia Preprocedure Evaluation (Signed)
Anesthesia Evaluation  Patient identified by MRN, date of birth, ID band Patient awake    Reviewed: Allergy & Precautions, H&P , Patient's Chart, lab work & pertinent test results  Airway Mallampati: II TM Distance: >3 FB Neck ROM: Full    Dental no notable dental hx. (+) Teeth Intact   Pulmonary neg pulmonary ROS,  breath sounds clear to auscultation  Pulmonary exam normal       Cardiovascular hypertension, Rhythm:Regular Rate:Normal     Neuro/Psych negative neurological ROS  negative psych ROS   GI/Hepatic Neg liver ROS, GERD-  Medicated and Controlled,  Endo/Other  negative endocrine ROS  Renal/GU negative Renal ROS  negative genitourinary   Musculoskeletal negative musculoskeletal ROS (+)   Abdominal   Peds  Hematology negative hematology ROS (+)   Anesthesia Other Findings   Reproductive/Obstetrics (+) Pregnancy                           Anesthesia Physical Anesthesia Plan  ASA: II  Anesthesia Plan: Epidural   Post-op Pain Management:    Induction:   Airway Management Planned: Natural Airway  Additional Equipment:   Intra-op Plan:   Post-operative Plan:   Informed Consent: I have reviewed the patients History and Physical, chart, labs and discussed the procedure including the risks, benefits and alternatives for the proposed anesthesia with the patient or authorized representative who has indicated his/her understanding and acceptance.     Plan Discussed with: Anesthesiologist  Anesthesia Plan Comments:         Anesthesia Quick Evaluation

## 2013-07-29 NOTE — H&P (Signed)
Mia Nguyen is a 33 y.o. female presenting for induction for SUA.  Maternal Medical History:  Fetal activity: Perceived fetal activity is normal.   Last perceived fetal movement was within the past hour.    Prenatal complications: no prenatal complications Prenatal Complications - Diabetes: none.    OB History   Grav Para Term Preterm Abortions TAB SAB Ect Mult Living   3 2 2       2      Past Medical History  Diagnosis Date  . Pregnancy induced hypertension     post partum with last pregnancy   No past surgical history on file. Family History: family history includes Cancer in her cousin and maternal grandfather; Hypertension in her maternal grandmother and mother; Stroke in her paternal grandfather. Social History:  reports that she has never smoked. She has never used smokeless tobacco. She reports that she does not drink alcohol or use illicit drugs.   Prenatal Transfer Tool  Maternal Diabetes: No Genetic Screening: Normal Maternal Ultrasounds/Referrals: Abnormal:  Findings:   Other:SUA Fetal Ultrasounds or other Referrals:  None Maternal Substance Abuse:  No Significant Maternal Medications:  None Significant Maternal Lab Results:  None Other Comments:  None  Review of Systems  All other systems reviewed and are negative.      Last menstrual period 10/29/2012. Maternal Exam:  Uterine Assessment: Contraction strength is mild.  Contraction frequency is irregular.   Abdomen: Patient reports no abdominal tenderness. Fetal presentation: vertex  Introitus: Normal vulva. Normal vagina.  Ferning test: not done.  Nitrazine test: not done. Amniotic fluid character: not assessed.  Pelvis: adequate for delivery.   Cervix: Cervix evaluated by digital exam.     Physical Exam  Constitutional: She is oriented to person, place, and time. She appears well-developed and well-nourished.  HENT:  Head: Normocephalic.  Neck: Normal range of motion.  Cardiovascular: Regular  rhythm.   Respiratory: Effort normal.  GI: Soft.  Genitourinary: Vagina normal and uterus normal.  Neurological: She is alert and oriented to person, place, and time.  Skin: Skin is warm and dry.    Prenatal labs: ABO, Rh: B/Positive/-- (09/04 0000) Antibody: Negative (09/04 0000) Rubella: Immune (09/04 0000) RPR: Nonreactive (09/04 0000)  HBsAg: Negative (09/04 0000)  HIV: Non-reactive (09/04 0000)  GBS: Negative (03/02 0000)   Assessment/Plan: SUA at 39 weeks for induction See orders   Gurpreet Mikhail J 07/29/2013, 7:12 AM

## 2013-07-29 NOTE — Anesthesia Procedure Notes (Signed)
Epidural Patient location during procedure: OB Start time: 07/29/2013 12:36 PM  Preanesthetic Checklist Completed: patient identified, site marked, surgical consent, pre-op evaluation, timeout performed, IV checked, risks and benefits discussed and monitors and equipment checked  Epidural Patient position: sitting Prep: site prepped and draped and DuraPrep Patient monitoring: continuous pulse ox and blood pressure Approach: midline Location: L3-L4 Injection technique: LOR air  Needle:  Needle type: Tuohy  Needle gauge: 17 G Needle length: 9 cm and 9 Needle insertion depth: 4 cm Catheter type: closed end flexible Catheter size: 19 Gauge Catheter at skin depth: 9 cm Test dose: negative and Other  Assessment Events: blood not aspirated, injection not painful, no injection resistance, negative IV test and no paresthesia  Additional Notes Patient identified. Risks and benefits discussed including failed block, incomplete  Pain control, post dural puncture headache, nerve damage, paralysis, blood pressure Changes, nausea, vomiting, reactions to medications-both toxic and allergic and post Partum back pain. All questions were answered. Patient expressed understanding and wished to proceed. Sterile technique was used throughout procedure. Epidural site was Dressed with sterile barrier dressing. No paresthesias, signs of intravascular injection Or signs of intrathecal spread were encountered.  Patient was more comfortable after the epidural was dosed. Please see RN's note for documentation of vital signs and FHR which are stable.

## 2013-07-30 ENCOUNTER — Encounter (HOSPITAL_COMMUNITY): Payer: Self-pay

## 2013-07-30 LAB — CBC
HCT: 35.1 % — ABNORMAL LOW (ref 36.0–46.0)
Hemoglobin: 12.1 g/dL (ref 12.0–15.0)
MCH: 32.8 pg (ref 26.0–34.0)
MCHC: 34.5 g/dL (ref 30.0–36.0)
MCV: 95.1 fL (ref 78.0–100.0)
PLATELETS: 187 10*3/uL (ref 150–400)
RBC: 3.69 MIL/uL — AB (ref 3.87–5.11)
RDW: 13.3 % (ref 11.5–15.5)
WBC: 13.6 10*3/uL — AB (ref 4.0–10.5)

## 2013-07-30 NOTE — Anesthesia Postprocedure Evaluation (Signed)
Anesthesia Post Note  Patient: Mia Nguyen  Procedure(s) Performed: * No procedures listed *  Anesthesia type: Epidural  Patient location: Mother/Baby  Post pain: Pain level controlled  Post assessment: Post-op Vital signs reviewed  Last Vitals:  Filed Vitals:   07/30/13 0500  BP: 132/86  Pulse: 84  Temp: 36.9 C  Resp: 18    Post vital signs: Reviewed  Level of consciousness:alert  Complications: No apparent anesthesia complications

## 2013-07-30 NOTE — Progress Notes (Signed)
Patient ID: Mia Nguyen, female   DOB: 01-14-1981, 33 y.o.   MRN: [id number] PPD # 1  Subjective: Pt reports feeling well / Pain controlled with ibuprofen Tolerating po/ Voiding without problems/ No n/v Bleeding is light to mod Newborn info:  Information for the patient's newborn:  Mia, Nguyen [[id number]]  female  / circ desired, and to be performed by Dr Ronita Hipps today / Feeding: breast   Objective:  VS: Blood pressure 132/86, pulse 84, temperature 98.5 F (36.9 C), temperature source Oral, resp. rate 18    Recent Labs  07/29/13 0730 07/30/13 0651  WBC 11.5* 13.6*  HGB 13.4 12.1  HCT 38.7 35.1*  PLT 201 187    Blood type: B POS Rubella: Immune    Physical Exam:  General: alert, cooperative and no distress CV: Regular rate and rhythm Resp: clear Abdomen: soft, nontender, normal bowel sounds Uterine Fundus: firm, below umbilicus, nontender Perineum: not inspected, pt is dressed and declines exam Lochia: moderate Ext: edema trace and Homans sign is negative, no sign of DVT   A/P: PPD # 1/ G3 P3003/ S/P: SVD w/2nd deg repair Doing well Continue routine post partum orders Anticipate D/C home in AM    Mia Crocker, MSN, [facility] 07/30/2013, 9:37 AM

## 2013-07-30 NOTE — Lactation Note (Signed)
This note was copied from the chart of [place]. Lactation Consultation Note  Patient Name: Mia Nguyen ZGYFV'C Date: 07/30/2013 Reason for consult: Initial assessment Baby is a pot irc baby . Has been sleepy per mom . LC changed smear mec diaper . And assisted with latch in cross cradle position . Baby latched with and multiply swallows noted. Breast fed on the right , left and relatched on the left . All 3 latches were with depth and a consistent pattern with multiply swallows and per mom comfortable . Due to mom already feeling  Sore , LC assessed and noticed the tops of both nipples were pinky red , no breakdown noted. @ consult worked on basics - breast massage , hand express, and breast compressions with latch until the baby is in a consistent swallowing pattern and then intermittent . LC being proactive recommended the use comfort gels , shells and hand pump if needed. Mom was instructed on all 3 items.  Mom aware of the [facility] and the [facility] O/P services.    Maternal Data Formula Feeding for Exclusion: No Infant to breast within first hour of birth: Yes Has patient been taught Hand Expression?: Yes Does the patient have breastfeeding experience prior to this delivery?: Yes  Feeding Feeding Type: Breast Fed Length of feed: 7 min (multiply swallows , increased with breast compressions )  LATCH Score/Interventions Latch: Grasps breast easily, tongue down, lips flanged, rhythmical sucking.  Audible Swallowing: Spontaneous and intermittent  Type of Nipple: Everted at rest and after stimulation  Comfort (Breast/Nipple): Soft / non-tender     Hold (Positioning): Assistance needed to correctly position infant at breast and maintain latch. (worked on depth ) Intervention(s): Breastfeeding basics reviewed;Support Pillows;Position options;Skin to skin  LATCH Score: 9  Lactation Tools Discussed/Used Tools: Shells;Pump;Comfort gels Shell Type: Inverted Breast pump type:  Manual WIC Program: No Pump Review: Setup, frequency, and cleaning;Milk Storage Initiated by:: MAI  Date initiated:: 07/30/13   Consult Status Consult Status: Follow-up Date: 07/31/13 Follow-up type: In-patient    Myer Haff 07/30/2013, 3:43 PM

## 2013-07-31 ENCOUNTER — Encounter (HOSPITAL_COMMUNITY): Payer: Self-pay

## 2013-07-31 DIAGNOSIS — Z87898 Personal history of other specified conditions: Secondary | ICD-10-CM

## 2013-07-31 HISTORY — DX: Personal history of other specified conditions: Z87.898

## 2013-07-31 MED ORDER — BUTALBITAL-APAP-CAFFEINE 50-325-40 MG PO TABS: 1.0000 | ORAL_TABLET | Freq: Four times a day (QID) | ORAL | Status: DC | PRN

## 2013-07-31 NOTE — Progress Notes (Signed)
Patient ID: Mia Nguyen, female   DOB: August 08, 1980, 33 y.o.   MRN: [id number] PPD # 2  Subjective: Pt reports feeling well and eager for d/c home / Pain controlled with ibuprofen Tolerating po/ Voiding without problems/ No n/v Bleeding is light/ Newborn info:  Information for the patient's newborn:  Anaira, Seay [[id number]]  female  / circ performed by Dr Ronita Hipps / Feeding: breast    Objective:  VS: Blood pressure 139/84, pulse 60, temperature 97.5 F (36.4 C), temperature source Oral, resp. rate 20.    Recent Labs  07/29/13 0730 07/30/13 0651  WBC 11.5* 13.6*  HGB 13.4 12.1  HCT 38.7 35.1*  PLT 201 187    Blood type:  B POS Rubella: Immune    Physical Exam:  General:  alert, cooperative and no distress CV: Regular rate and rhythm Resp: clear Abdomen: soft, nontender, normal bowel sounds Uterine Fundus: firm, below umbilicus, nontender Perineum: healing with good reapproximation Lochia: minimal Ext: no edema, redness or tenderness in the calves or thighs    A/P: PPD # 2/ G3P3003/ S/P:SVD w/ 2nd deg repair Doing well and stable for discharge home OTC Ibuprofen 600mg  prn WOB/GYN booklet given Routine pp visit in [place], MSN, [facility] 07/31/2013, 8:35 AM

## 2013-07-31 NOTE — Discharge Summary (Signed)
Obstetric Discharge Summary Reason for Admission: G3 P2 0 0 2 @ 39wks for IOL d/t SUA Prenatal Procedures: NST and ultrasound Intrapartum Procedures: spontaneous vaginal delivery Postpartum Procedures: none Complications-Operative and Postpartum: 2nd degree perineal laceration Hemoglobin  Date Value Ref Range Status  07/30/2013 12.1  12.0 - 15.0 g/dL Final     HCT  Date Value Ref Range Status  07/30/2013 35.1* 36.0 - 46.0 % Final    Physical Exam:  General: alert, cooperative and no distress Lochia: appropriate Uterine Fundus: firm DVT Evaluation: No evidence of DVT seen on physical exam. Negative Homan's sign.  Discharge Diagnoses: G3 P3 0 0 3 S/P SVD w/ 2nd deg repair @ 39wks on 07/28/13  Discharge Information: Date: 07/31/2013 Activity: pelvic rest Diet: routine Medications: PNV, Ibuprofen and Colace Condition: stable Instructions: refer to practice specific booklet Discharge to: home Follow-up Information   Follow up with Lovenia Kim, MD In 6 weeks.   Specialty:  Obstetrics and Gynecology   Contact information:   [address] [place] [address] [phone]       Newborn Data: Live born female on 07/28/13 Iona Beard) Birth Weight: 6 lb 15.1 oz (3150 g) APGAR: 8, 9  Home with mother.  Rayson Rando K 07/31/2013, 8:39 AM

## 2013-07-31 NOTE — Lactation Note (Signed)
This note was copied from the chart of [name]. Lactation Consultation Note Follow up consult:  Mother has baby at breast in cross cradle. Mother's nipples are pink and sore.  She has comfort gels. Helped mother place baby in football hold to increase depth. Sucks and swallows observed some with stimulation, both lips flanged. Reviewed waking techniques. Reviewed feeding 8-12 times a day for more than 10 min to 30 min per side, massaging breasts to keep baby active at breast. Encouraged mother to call if further assistance is needed.  Patient Name: Mia Nguyen EHUDJ'S Date: 07/31/2013 Reason for consult: Follow-up assessment   Maternal Data    Feeding Feeding Type: Breast Fed  LATCH Score/Interventions Latch: Grasps breast easily, tongue down, lips flanged, rhythmical sucking.  Audible Swallowing: A few with stimulation  Type of Nipple: Everted at rest and after stimulation  Comfort (Breast/Nipple): Filling, red/small blisters or bruises, mild/mod discomfort  Problem noted: Mild/Moderate discomfort Interventions (Mild/moderate discomfort): Comfort gels;Hand expression  Hold (Positioning): Assistance needed to correctly position infant at breast and maintain latch.  LATCH Score: 7  Lactation Tools Discussed/Used     Consult Status Consult Status: Complete    Carlye Grippe 07/31/2013, 10:21 AM

## 2013-07-31 NOTE — Discharge Instructions (Signed)
Continue Ibuprofen 600mg  every 6 hours until Friday and then every 6 hours as needed.

## 2013-10-01 ENCOUNTER — Encounter (INDEPENDENT_AMBULATORY_CARE_PROVIDER_SITE_OTHER): Payer: Self-pay | Admitting: General Surgery

## 2013-10-01 ENCOUNTER — Ambulatory Visit (INDEPENDENT_AMBULATORY_CARE_PROVIDER_SITE_OTHER): Payer: BC Managed Care – PPO | Admitting: General Surgery

## 2013-10-01 VITALS — BP 118/80 | HR 61 | Temp 97.5°F | Ht 63.0 in | Wt 133.0 lb

## 2013-10-01 DIAGNOSIS — K429 Umbilical hernia without obstruction or gangrene: Secondary | ICD-10-CM | POA: Insufficient documentation

## 2013-10-01 NOTE — Progress Notes (Signed)
Patient ID: Mia Nguyen, female   DOB: Jun 12, 1980, 33 y.o.   MRN: [id number]  Chief Complaint  Patient presents with  . Umbilical Hernia    HPI Mia Nguyen is a 32 y.o. female.   HPI  She is referred by Dr. Ronita Hipps for further evaluation and treatment of a painful umbilical hernia.  She is 2 months postpartum from having her third child. During pregnancy, she noticed an abnormality of her umbilicus. Swelling there has persisted. Dr. Hall Busing has confirmed that she has an umbilical hernia. It does cause her pain at times when she has to do some heavy activity. No obstructive symptoms. No problems with constipation or difficulty with urination. She does have to lift her children a lot.  Past Medical History  Diagnosis Date  . Pregnancy induced hypertension     post partum with last pregnancy  . SVD (spontaneous vaginal delivery) 07/30/2013  . Postpartum care following vaginal delivery (07/29/13) 04/20/2011  . History of headache 07/31/2013    History reviewed. No pertinent past surgical history.  Family History  Problem Relation Age of Onset  . Hypertension Maternal Grandmother   . Cancer Maternal Grandfather     prostate  . Cancer Cousin     breast  . Hypertension Mother   . Stroke Paternal Grandfather     Social History History  Substance Use Topics  . Smoking status: Never Smoker   . Smokeless tobacco: Never Used  . Alcohol Use: No    Allergies  Allergen Reactions  . Sulfa Antibiotics Other (See Comments)    "white heads all over"    Current Outpatient Prescriptions  Medication Sig Dispense Refill  . acetaminophen (TYLENOL) 325 MG tablet Take 650 mg by mouth every 6 (six) hours as needed. For pain       . EVENING PRIMROSE OIL PO Take 1 capsule by mouth 2 (two) times daily.      . Prenatal Vit-Fe Fumarate-FA (PRENATAL MULTIVITAMIN) TABS tablet Take 1 tablet by mouth daily at 12 noon.      . ranitidine (ZANTAC) 75 MG tablet Take 75 mg by mouth daily as needed for heartburn.       . sodium chloride (OCEAN) 0.65 % SOLN nasal spray Place 2 sprays into both nostrils as needed for congestion.       No current facility-administered medications for this visit.    Review of Systems Review of Systems  Constitutional: Negative.   HENT: Negative.   Respiratory: Negative.   Cardiovascular: Negative.   Gastrointestinal: Positive for abdominal pain (At umbilical hernia).  Genitourinary: Negative.   Musculoskeletal: Negative.   Neurological: Negative.   Hematological: Negative.     Blood pressure 118/80, pulse 61, temperature 97.5 F (36.4 C), height 5\' 3"  (1.6 m), weight 133 lb (60.328 kg), unknown if currently breastfeeding.  Physical Exam Physical Exam  Constitutional: She appears well-developed and well-nourished. No distress.  HENT:  Head: Normocephalic and atraumatic.  Eyes: No scleral icterus.  Cardiovascular: Normal rate and regular rhythm.   Pulmonary/Chest: Effort normal and breath sounds normal.  Abdominal: Soft.  Umbilical bulge that is partially reducible but too uncomfortable to reduce completely today.  Musculoskeletal: She exhibits no edema.  Neurological: She is alert.  Skin: Skin is warm and dry.  Psychiatric: She has a normal mood and affect. Her behavior is normal.    Data Reviewed Note from Dr. Ronita Hipps  Assessment    Symptomatic umbilical hernia.     Plan    We  discussed umbilical hernia repair with mesh. Given the fact that she has a newborn other small children, this is not a good time for her to have the repair. We discussed having it in the future when she can arrange for childcare during the recovery period.   I have discussed the procedure, risks, and aftercare.  Risks include but are not limited to bleeding, infection, wound problems, anesthesia, recurrence, injury to intestine, mesh problems.  We also discussed not knowing what the umbilicus would look like in the future.  She will call back when she would like to schedule the  hernia repair.      Rhunette Croft Momina Hunton 10/01/2013, 5:13 PM

## 2013-10-01 NOTE — Patient Instructions (Signed)
Avoid physical activities that cause pain at the site of the hernia. Please call when you would like to schedule hernia surgery.

## 2014-01-09 ENCOUNTER — Encounter (HOSPITAL_COMMUNITY)
Admission: RE | Admit: 2014-01-09 | Discharge: 2014-01-09 | Disposition: A | Discharge status: Home / Self Care | Payer: BC Managed Care – PPO | Source: Ambulatory Visit | Attending: Obstetrics and Gynecology | Admitting: Obstetrics and Gynecology

## 2014-01-09 DIAGNOSIS — O923 Agalactia: Secondary | ICD-10-CM | POA: Insufficient documentation

## 2014-03-03 ENCOUNTER — Encounter (INDEPENDENT_AMBULATORY_CARE_PROVIDER_SITE_OTHER): Payer: Self-pay | Admitting: General Surgery

## 2014-03-11 ENCOUNTER — Encounter (HOSPITAL_COMMUNITY)
Admission: RE | Admit: 2014-03-11 | Discharge: 2014-03-11 | Disposition: A | Discharge status: Home / Self Care | Payer: BC Managed Care – PPO | Source: Ambulatory Visit | Attending: Obstetrics and Gynecology | Admitting: Obstetrics and Gynecology

## 2014-03-11 DIAGNOSIS — O923 Agalactia: Secondary | ICD-10-CM | POA: Diagnosis present

## 2014-04-10 ENCOUNTER — Encounter (HOSPITAL_COMMUNITY)
Admission: RE | Admit: 2014-04-10 | Discharge: 2014-04-10 | Disposition: A | Discharge status: Home / Self Care | Payer: BC Managed Care – PPO | Source: Ambulatory Visit | Attending: Obstetrics and Gynecology | Admitting: Obstetrics and Gynecology

## 2014-04-10 DIAGNOSIS — O923 Agalactia: Secondary | ICD-10-CM | POA: Insufficient documentation

## 2014-09-01 ENCOUNTER — Ambulatory Visit (INDEPENDENT_AMBULATORY_CARE_PROVIDER_SITE_OTHER): Payer: 59 | Admitting: Internal Medicine

## 2014-09-01 ENCOUNTER — Encounter: Payer: Self-pay | Admitting: Internal Medicine

## 2014-09-01 VITALS — BP 120/80 | HR 69 | Temp 98.0°F | Ht 63.0 in | Wt 118.0 lb

## 2014-09-01 DIAGNOSIS — J069 Acute upper respiratory infection, unspecified: Secondary | ICD-10-CM | POA: Diagnosis not present

## 2014-09-01 DIAGNOSIS — H6503 Acute serous otitis media, bilateral: Secondary | ICD-10-CM

## 2014-09-01 MED ORDER — AZITHROMYCIN 250 MG PO TABS: ORAL_TABLET | ORAL | Status: DC

## 2014-09-01 NOTE — Progress Notes (Signed)
   Subjective:    Patient ID: Mia Nguyen, female    DOB: [birth year], 34 y.o.   MRN: [id number]  HPI 34 year old Female not seen here since 2013. She was diagnosed with strep throat in urgent care on April 20. She took 10 day course of penicillin but still does not feel 100% better. Has a little bit of cough. Still ear stuffiness. Still has some sore throat. Child was sick recently with pinkeye and respiratory infection but apparently did not have strep. Slight cough. No fever or shaking chills.  Past medical history: She is allergic to Sulfa-- causes a rash.  3 pregnancies- no miscarriages  Takes biotin and prenatal vitamins  No history of serious illnesses accidents or operations will syndrome as a  She is a Agricultural engineer. Husband is employed in Scientist, research (life sciences) estate with [employer]. She has a 4 year college degree. Does not smoke. Social alcohol consumption.    Review of Systems     Objective:   Physical Exam Pharynx noninjected. TMs are full bilaterally but not red. Neck is supple without adenopathy. Chest clear to auscultation. Rapid strep screen is negative       Assessment & Plan:  Bilateral serous otitis media  Protracted URI  Plan: Zithromax Z-PAK take 2 tablets day one followed by 1 tablet days 2 through 5. Call if not better in 10 days or sooner if worse.

## 2014-09-01 NOTE — Patient Instructions (Signed)
Take Zithromax Z-PAK as prescribed. Call if not better in 10 days or sooner if worse.

## 2014-10-06 ENCOUNTER — Ambulatory Visit (INDEPENDENT_AMBULATORY_CARE_PROVIDER_SITE_OTHER): Payer: 59 | Admitting: Internal Medicine

## 2014-10-06 ENCOUNTER — Encounter: Payer: Self-pay | Admitting: Internal Medicine

## 2014-10-06 VITALS — BP 104/64 | HR 67 | Temp 97.5°F | Wt 119.0 lb

## 2014-10-06 DIAGNOSIS — J069 Acute upper respiratory infection, unspecified: Secondary | ICD-10-CM

## 2014-10-06 MED ORDER — AZITHROMYCIN 250 MG PO TABS: ORAL_TABLET | ORAL | Status: DC

## 2014-10-06 MED ORDER — BENZONATATE 200 MG PO CAPS: 200.0000 mg | ORAL_CAPSULE | Freq: Three times a day (TID) | ORAL | Status: DC | PRN

## 2014-10-06 NOTE — Progress Notes (Signed)
   Subjective:    Patient ID: Mia Nguyen, female    DOB: 1980-10-16, 34 y.o.   MRN: [id number]  HPI Was here May 2 with respiratory infection. Treated with Zithromax Z-Pak and got better. She is leaving in a couple of days to go to [place] in [place]. Has had a cough and congestion. Symptoms present for 2 days. Husband has had similar illness. Some white sputum production. No fever or shaking chills. No sore throat.    Review of Systems no sore throat, no ear pain.     Objective:   Physical Exam  Skin warm and dry. Nodes none. TMs and pharynx are clear. Neck supple. Chest clear to auscultation without rales or wheezing      Assessment & Plan:  Acute URI  Plan: Zithromax Z-Pak take 2 tablets day one followed by 1 tablet days 2 through 5. Tessalon Perles 200 mg 3 times daily as needed for cough.

## 2014-10-06 NOTE — Patient Instructions (Signed)
Takes Zithromax and [employer] as directed.

## 2014-10-16 ENCOUNTER — Encounter: Payer: Self-pay | Admitting: Family Medicine

## 2014-10-17 ENCOUNTER — Encounter: Payer: Self-pay | Admitting: Family Medicine

## 2014-10-17 ENCOUNTER — Ambulatory Visit (INDEPENDENT_AMBULATORY_CARE_PROVIDER_SITE_OTHER): Payer: 59 | Admitting: Family Medicine

## 2014-10-17 VITALS — BP 118/72 | HR 73 | Ht 63.0 in | Wt 121.0 lb

## 2014-10-17 DIAGNOSIS — M9903 Segmental and somatic dysfunction of lumbar region: Secondary | ICD-10-CM | POA: Diagnosis not present

## 2014-10-17 DIAGNOSIS — M533 Sacrococcygeal disorders, not elsewhere classified: Secondary | ICD-10-CM | POA: Diagnosis not present

## 2014-10-17 DIAGNOSIS — M9902 Segmental and somatic dysfunction of thoracic region: Secondary | ICD-10-CM

## 2014-10-17 DIAGNOSIS — M9904 Segmental and somatic dysfunction of sacral region: Secondary | ICD-10-CM

## 2014-10-17 DIAGNOSIS — M999 Biomechanical lesion, unspecified: Secondary | ICD-10-CM

## 2014-10-17 NOTE — Assessment & Plan Note (Signed)
Decision today to treat with OMT was based on Physical Exam  After verbal consent patient was treated with HVLA, ME techniques in Cervical, thoracic, lumbar areas  Patient tolerated the procedure well with improvement in symptoms  Patient given exercises, stretches and lifestyle modifications  See medications in patient instructions if given  Patient will follow up in 3 weeks

## 2014-10-17 NOTE — Patient Instructions (Addendum)
Good to see you.  Ice 20 minutes 2 times daily. Usually after activity and before bed. Exercises 3 times a week. Look at handout.  OK to do daily but no more than that.  pennsaid pinkie amount topically 2 times daily as needed.  Stool softener for next week Consider fiber tab Sit on donut if you can for at least next week Avoid direct contact if possible to the area See me again in 3 weeks

## 2014-10-17 NOTE — Assessment & Plan Note (Signed)
Discussed topical, possible nerve related, discussed HEP and ice  Discussed what activities to avoid and to avoid impact Stool softeners in case constipation is contributing RTC in 3-4 weeks.

## 2014-10-17 NOTE — Progress Notes (Signed)
Pre visit review using our clinic review tool, if applicable. No additional management support is needed unless otherwise documented below in the visit note. 

## 2014-10-17 NOTE — Progress Notes (Signed)
[facility] [address], [place] [address] Phone: [phone] Subjective:    I'm seeing this patient by the request  of:  Elby Showers, MD   CC: Sacral pain  Mia Nguyen is a 34 y.o. female coming in with complaint of sacral pain. Patient states that she did have a sacral fracture greater than 2 years ago. Patient did give birth to her third child vaginally and noticed more pain, worse with sitting, can even hurt laying down, very bottom area, midline. Patient denies any radiation down the legs any numbness or tingling. No significant association with bowel movements. Patient has noticed she's been more constipated recently. States that the pain has been worsening over the course last 2 months. Denies any redness that she thinks or what she can see. Rates severity 8/10.  Patient states that over-the-counter anti-inflammatory does seem to be helpful sometimes.     Past Medical History  Diagnosis Date  . Pregnancy induced hypertension     post partum with last pregnancy  . SVD (spontaneous vaginal delivery) 07/30/2013  . Postpartum care following vaginal delivery (07/29/13) 04/20/2011  . History of headache 07/31/2013   No past surgical history on file. Allergies  Allergen Reactions  . Sulfa Antibiotics Other (See Comments)    "white heads all over"   Family History  Problem Relation Age of Onset  . Hypertension Maternal Grandmother   . Cancer Maternal Grandfather     prostate  . Cancer Cousin     breast  . Hypertension Mother   . Stroke Paternal Grandfather    History  Substance Use Topics  . Smoking status: Never Smoker   . Smokeless tobacco: Never Used  . Alcohol Use: No     Past medical history, social, surgical and family history all reviewed in electronic medical record.   Review of Systems: No headache, visual changes, nausea, vomiting, diarrhea, constipation, dizziness, abdominal pain, skin rash, fevers, chills,  night sweats, weight loss, swollen lymph nodes, body aches, joint swelling, muscle aches, chest pain, shortness of breath, mood changes.   Objective Last menstrual period 10/01/2014, unknown if currently breastfeeding.  General: No apparent distress alert and oriented x3 mood and affect normal, dressed appropriately.  HEENT: Pupils equal, extraocular movements intact  Respiratory: Patient's speak in full sentences and does not appear short of breath  Cardiovascular: No lower extremity edema, non tender, no erythema  Skin: Warm dry intact with no signs of infection or rash on extremities or on axial skeleton.  Abdomen: Soft nontender  Neuro: Cranial nerves II through XII are intact, neurovascularly intact in all extremities with 2+ DTRs and 2+ pulses.  Lymph: No lymphadenopathy of posterior or anterior cervical chain or axillae bilaterally.  Gait normal with good balance and coordination.  MSK:  Non tender with full range of motion and good stability and symmetric strength and tone of shoulders, elbows, wrist, hip, knee and ankles bilaterally.  Back Exam:  Inspection: Unremarkable  Motion: Flexion 45 deg, Extension 45 deg, Side Bending to 45 deg bilaterally,  Rotation to 45 deg bilaterally  SLR laying: Negative  XSLR laying: Negative  Palpable tenderness: Some tenderness over the coccyx. FABER: Mild tightness bilaterally but minimal pain Sensory change: Gross sensation intact to all lumbar and sacral dermatomes.  Reflexes: 2+ at both patellar tendons, 2+ at achilles tendons, Babinski's downgoing.  Strength at foot  Plantar-flexion: 5/5 Dorsi-flexion: 5/5 Eversion: 5/5 Inversion: 5/5  Leg strength  Quad: 5/5 Hamstring:  5/5 Hip flexor: 5/5 Hip abductors: 5/5  Gait unremarkable.  Osteopathic findings Cervical C2 flexed rotated and side bent left Thoracic T3 extended rotated and side bent right Lumbar L3 flexed rotated and side bent left Sacrum Bilateral extension    Impression  and Recommendations:     This case required medical decision making of moderate complexity.

## 2014-11-18 ENCOUNTER — Encounter: Payer: Self-pay | Admitting: Family Medicine

## 2014-11-18 ENCOUNTER — Ambulatory Visit: Payer: 59 | Admitting: Family Medicine

## 2014-11-18 ENCOUNTER — Ambulatory Visit (INDEPENDENT_AMBULATORY_CARE_PROVIDER_SITE_OTHER): Payer: 59 | Admitting: Family Medicine

## 2014-11-18 VITALS — BP 114/76 | HR 63 | Wt 124.0 lb

## 2014-11-18 DIAGNOSIS — M9904 Segmental and somatic dysfunction of sacral region: Secondary | ICD-10-CM

## 2014-11-18 DIAGNOSIS — M9903 Segmental and somatic dysfunction of lumbar region: Secondary | ICD-10-CM

## 2014-11-18 DIAGNOSIS — M533 Sacrococcygeal disorders, not elsewhere classified: Secondary | ICD-10-CM | POA: Diagnosis not present

## 2014-11-18 DIAGNOSIS — M9902 Segmental and somatic dysfunction of thoracic region: Secondary | ICD-10-CM

## 2014-11-18 DIAGNOSIS — M999 Biomechanical lesion, unspecified: Secondary | ICD-10-CM

## 2014-11-18 NOTE — Patient Instructions (Signed)
Good to see you I will keep working onit Continue everything else you are doing In car consider rolling up towel just above knees See me again in 3 weeks.

## 2014-11-18 NOTE — Assessment & Plan Note (Signed)
Decision today to treat with OMT was based on Physical Exam  After verbal consent patient was treated with HVLA, ME techniques in Cervical, thoracic, lumbar areas  Patient tolerated the procedure well with improvement in symptoms  Patient given exercises, stretches and lifestyle modifications  See medications in patient instructions if given  Patient will follow up in 3 weeks

## 2014-11-18 NOTE — Progress Notes (Signed)
[facility] [address], [place] [address] Phone: [phone] Subjective:    I'm seeing this patient by the request  of:  Elby Showers, MD   CC: Sacral pain  BTD:[id number] Mia Nguyen is a 34 y.o. female coming in with complaint of sacral pain. Patient did have more of a coccygodynia. Patient did have osteopathic manipulation, we tried topical anti-5 m, we discussed icing protocol and over-the-counter natural supplementation. We discussed home exercises as well. Patient states she has made some improvement. States that the manipulation has been very helpful. Patient states the over-the-counter natural supplements may have helped a little bit as well. States though that the different sitting postures not been as helpful. States that the topical anti-inflammatory has not been helpful either. States that if she sits on for for too long she continues to have the discomfort. Overall has minimal benefit but unfortunately still not pain-free.     Past Medical History  Diagnosis Date  . Pregnancy induced hypertension     post partum with last pregnancy  . SVD (spontaneous vaginal delivery) 07/30/2013  . Postpartum care following vaginal delivery (07/29/13) 04/20/2011  . History of headache 07/31/2013   History reviewed. No pertinent past surgical history. Allergies  Allergen Reactions  . Sulfa Antibiotics Other (See Comments)    "white heads all over"   Family History  Problem Relation Age of Onset  . Hypertension Maternal Grandmother   . Cancer Maternal Grandfather     prostate  . Cancer Cousin     breast  . Hypertension Mother   . Stroke Paternal Grandfather    History  Substance Use Topics  . Smoking status: Never Smoker   . Smokeless tobacco: Never Used  . Alcohol Use: No     Past medical history, social, surgical and family history all reviewed in electronic medical record.   Review of Systems: No headache, visual changes, nausea,  vomiting, diarrhea, constipation, dizziness, abdominal pain, skin rash, fevers, chills, night sweats, weight loss, swollen lymph nodes, body aches, joint swelling, muscle aches, chest pain, shortness of breath, mood changes.   Objective Blood pressure 114/76, pulse 63, weight 124 lb (56.246 kg), SpO2 99 %, unknown if currently breastfeeding.  General: No apparent distress alert and oriented x3 mood and affect normal, dressed appropriately.  HEENT: Pupils equal, extraocular movements intact  Respiratory: Patient's speak in full sentences and does not appear short of breath  Cardiovascular: No lower extremity edema, non tender, no erythema  Skin: Warm dry intact with no signs of infection or rash on extremities or on axial skeleton.  Abdomen: Soft nontender  Neuro: Cranial nerves II through XII are intact, neurovascularly intact in all extremities with 2+ DTRs and 2+ pulses.  Lymph: No lymphadenopathy of posterior or anterior cervical chain or axillae bilaterally.  Gait normal with good balance and coordination.  MSK:  Non tender with full range of motion and good stability and symmetric strength and tone of shoulders, elbows, wrist, hip, knee and ankles bilaterally.  Back Exam:  Inspection: Unremarkable  Motion: Flexion 45 deg, Extension 45 deg, Side Bending to 45 deg bilaterally,  Rotation to 45 deg bilaterally  SLR laying: Negative  XSLR laying: Negative  Palpable tenderness: Continued tenderness over the coccyx. FABER: Mild tightness bilaterally but minimal pain Sensory change: Gross sensation intact to all lumbar and sacral dermatomes.  Reflexes: 2+ at both patellar tendons, 2+ at achilles tendons, Babinski's downgoing.  Strength at foot  Plantar-flexion: 5/5  Dorsi-flexion: 5/5 Eversion: 5/5 Inversion: 5/5  Leg strength  Quad: 5/5 Hamstring: 5/5 Hip flexor: 5/5 Hip abductors: 5/5  Gait unremarkable.  Osteopathic findings Cervical C2 flexed rotated and side bent left Thoracic T3  extended rotated and side bent right Lumbar L3 flexed rotated and side bent left Sacrum Left on left which is a new pattern    Impression and Recommendations:     This case required medical decision making of moderate complexity.

## 2014-11-18 NOTE — Assessment & Plan Note (Signed)
Has responded somewhat to osteopathic manipulation alone continued to attempt. We discussed different postural changes and other ergonomic changes that could also be helpful. Patient will continue the other conservative therapies were discussed. Patient will start being more active and do yoga. Patient come back and see me again 3-4 weeks for further evaluation and treatment.

## 2014-12-09 ENCOUNTER — Ambulatory Visit (INDEPENDENT_AMBULATORY_CARE_PROVIDER_SITE_OTHER): Payer: 59 | Admitting: Family Medicine

## 2014-12-09 ENCOUNTER — Encounter: Payer: Self-pay | Admitting: Family Medicine

## 2014-12-09 VITALS — BP 108/72 | HR 71 | Ht 63.0 in | Wt 124.0 lb

## 2014-12-09 DIAGNOSIS — M9903 Segmental and somatic dysfunction of lumbar region: Secondary | ICD-10-CM

## 2014-12-09 DIAGNOSIS — M9902 Segmental and somatic dysfunction of thoracic region: Secondary | ICD-10-CM

## 2014-12-09 DIAGNOSIS — M999 Biomechanical lesion, unspecified: Secondary | ICD-10-CM

## 2014-12-09 DIAGNOSIS — M533 Sacrococcygeal disorders, not elsewhere classified: Secondary | ICD-10-CM | POA: Diagnosis not present

## 2014-12-09 DIAGNOSIS — M9904 Segmental and somatic dysfunction of sacral region: Secondary | ICD-10-CM

## 2014-12-09 NOTE — Progress Notes (Signed)
[facility] [address] [place], [place] [address] Phone: [phone] Subjective:    I'm seeing this patient by the request  of:  Elby Showers, MD   CC: Sacral pain  [id number] Mia Nguyen is a 34 y.o. female coming in with complaint of sacral pain. Patient did have more of a coccygodynia. Patient did have osteopathic manipulation, we tried topical anti-inflammatory's, we discussed icing protocol and over-the-counter natural supplementation. Patient is overall she is feeling significantly better. No significant pain on the buttocks region. Nothing that is going down her legs. Patient states over the last day or 2 she had any difficulties whatsoever. Patient is very happy with the results.     Past Medical History  Diagnosis Date  . Pregnancy induced hypertension     post partum with last pregnancy  . SVD (spontaneous vaginal delivery) 07/30/2013  . Postpartum care following vaginal delivery (07/29/13) 04/20/2011  . History of headache 07/31/2013   No past surgical history on file. Allergies  Allergen Reactions  . Sulfa Antibiotics Other (See Comments)    "white heads all over"   Family History  Problem Relation Age of Onset  . Hypertension Maternal Grandmother   . Cancer Maternal Grandfather     prostate  . Cancer Cousin     breast  . Hypertension Mother   . Stroke Paternal Grandfather    History  Substance Use Topics  . Smoking status: Never Smoker   . Smokeless tobacco: Never Used  . Alcohol Use: No     Past medical history, social, surgical and family history all reviewed in electronic medical record.   Review of Systems: No headache, visual changes, nausea, vomiting, diarrhea, constipation, dizziness, abdominal pain, skin rash, fevers, chills, night sweats, weight loss, swollen lymph nodes, body aches, joint swelling, muscle aches, chest pain, shortness of breath, mood changes.   Objective Blood pressure 108/72, pulse 71, height  5\' 3"  (1.6 m), weight 124 lb (56.246 kg), SpO2 98 %, unknown if currently breastfeeding.  General: No apparent distress alert and oriented x3 mood and affect normal, dressed appropriately.  HEENT: Pupils equal, extraocular movements intact  Respiratory: Patient's speak in full sentences and does not appear short of breath  Cardiovascular: No lower extremity edema, non tender, no erythema  Skin: Warm dry intact with no signs of infection or rash on extremities or on axial skeleton.  Abdomen: Soft nontender  Neuro: Cranial nerves II through XII are intact, neurovascularly intact in all extremities with 2+ DTRs and 2+ pulses.  Lymph: No lymphadenopathy of posterior or anterior cervical chain or axillae bilaterally.  Gait normal with good balance and coordination.  MSK:  Non tender with full range of motion and good stability and symmetric strength and tone of shoulders, elbows, wrist, hip, knee and ankles bilaterally.  Back Exam:  Inspection: Unremarkable  Motion: Flexion 45 deg, Extension 35 deg, Side Bending to 45 deg bilaterally,  Rotation to 45 deg bilaterally  SLR laying: Negative  XSLR laying: Negative  Palpable tenderness: Nontender over the coccyx today FABER: Mild tightness bilaterally but no pain Sensory change: Gross sensation intact to all lumbar and sacral dermatomes.  Reflexes: 2+ at both patellar tendons, 2+ at achilles tendons, Babinski's downgoing.  Strength at foot  Plantar-flexion: 5/5 Dorsi-flexion: 5/5 Eversion: 5/5 Inversion: 5/5  Leg strength  Quad: 5/5 Hamstring: 5/5 Hip flexor: 5/5 Hip abductors: 5/5  Gait unremarkable.  Osteopathic findings Cervical C2 flexed rotated and side bent left Thoracic T3  extended rotated and side bent right Lumbar L3 flexed rotated and side bent left Sacrum Left on left  Right posterior ilium    Impression and Recommendations:     This case required medical decision making of moderate complexity.

## 2014-12-09 NOTE — Progress Notes (Signed)
Pre visit review using our clinic review tool, if applicable. No additional management support is needed unless otherwise documented below in the visit note. 

## 2014-12-09 NOTE — Patient Instructions (Addendum)
Good to see you Ice is your friend Continue the exercises core is doing great! Continue the vitamins Stay active Enjoy the rest of the summer.  See me again in 4 weeks.

## 2014-12-09 NOTE — Assessment & Plan Note (Signed)
Decision today to treat with OMT was based on Physical Exam  After verbal consent patient was treated with HVLA, ME techniques in Cervical, thoracic, lumbar areas  Patient tolerated the procedure well with improvement in symptoms  Patient given exercises, stretches and lifestyle modifications  See medications in patient instructions if given  Patient will follow up in 4 weeks

## 2014-12-09 NOTE — Assessment & Plan Note (Signed)
Patient is doing significantly better at this time. Encourage her to continue core exercises as well as some voiding significant contact in the area. We discussed icing regimen and home exercises. We discussed what activities potentially avoid. Patient will be excellent to 4 week intervals.

## 2015-01-07 ENCOUNTER — Ambulatory Visit (INDEPENDENT_AMBULATORY_CARE_PROVIDER_SITE_OTHER): Payer: 59 | Admitting: Family Medicine

## 2015-01-07 ENCOUNTER — Encounter: Payer: Self-pay | Admitting: Family Medicine

## 2015-01-07 VITALS — BP 110/74 | HR 67 | Ht 63.0 in | Wt 121.0 lb

## 2015-01-07 DIAGNOSIS — M533 Sacrococcygeal disorders, not elsewhere classified: Secondary | ICD-10-CM

## 2015-01-07 DIAGNOSIS — M9903 Segmental and somatic dysfunction of lumbar region: Secondary | ICD-10-CM

## 2015-01-07 DIAGNOSIS — M9902 Segmental and somatic dysfunction of thoracic region: Secondary | ICD-10-CM | POA: Diagnosis not present

## 2015-01-07 DIAGNOSIS — M9904 Segmental and somatic dysfunction of sacral region: Secondary | ICD-10-CM | POA: Diagnosis not present

## 2015-01-07 DIAGNOSIS — M999 Biomechanical lesion, unspecified: Secondary | ICD-10-CM

## 2015-01-07 NOTE — Assessment & Plan Note (Signed)
Patient is doing considerably better. Encourage her to continue to work on core strengthening and patient will start to increase her running on a more protocol that I think will be beneficial. Patient is doing so well we will space out to 4-6 week intervals.

## 2015-01-07 NOTE — Assessment & Plan Note (Signed)
Decision today to treat with OMT was based on Physical Exam  After verbal consent patient was treated with HVLA, ME techniques in Cervical, thoracic, lumbar areas  Patient tolerated the procedure well with improvement in symptoms  Patient given exercises, stretches and lifestyle modifications  See medications in patient instructions if given  Patient will follow up in 4-6 weeks

## 2015-01-07 NOTE — Progress Notes (Signed)
[facility] [address] [place], [place] [address] Phone: [phone] Subjective:    I'm seeing this patient by the request  of:  Elby Showers, MD   CC: Sacral pain  [id number] Mia Nguyen is a 34 y.o. female coming in with complaint of sacral pain. Patient did have more of a coccygodynia. Patient did have osteopathic manipulation, we tried topical anti-inflammatory's, we discussed icing protocol and over-the-counter natural supplementation. Patient was doing significantly better. Patient does not have any significant pain that was stopped on her even at last visit. Since last visit patient states she continues to do very well. Patient has increased her activity and increase her range of motion. Patient has started to work on a more regular basis.  Patient is also running. Patient states that she has noticed some mild increasing in discomfort over the course of multiple days but overall still significantly better than the first visit.    Past Medical History  Diagnosis Date  . Pregnancy induced hypertension     post partum with last pregnancy  . SVD (spontaneous vaginal delivery) 07/30/2013  . Postpartum care following vaginal delivery (07/29/13) 04/20/2011  . History of headache 07/31/2013   No past surgical history on file. Allergies  Allergen Reactions  . Sulfa Antibiotics Other (See Comments)    "white heads all over"   Family History  Problem Relation Age of Onset  . Hypertension Maternal Grandmother   . Cancer Maternal Grandfather     prostate  . Cancer Cousin     breast  . Hypertension Mother   . Stroke Paternal Grandfather    Social History  Substance Use Topics  . Smoking status: Never Smoker   . Smokeless tobacco: Never Used  . Alcohol Use: No     Past medical history, social, surgical and family history all reviewed in electronic medical record.   Review of Systems: No headache, visual changes, nausea, vomiting, diarrhea,  constipation, dizziness, abdominal pain, skin rash, fevers, chills, night sweats, weight loss, swollen lymph nodes, body aches, joint swelling, muscle aches, chest pain, shortness of breath, mood changes.   Objective Blood pressure 110/74, pulse 67, height 5\' 3"  (1.6 m), weight 121 lb (54.885 kg), SpO2 98 %, unknown if currently breastfeeding.  General: No apparent distress alert and oriented x3 mood and affect normal, dressed appropriately.  HEENT: Pupils equal, extraocular movements intact  Respiratory: Patient's speak in full sentences and does not appear short of breath  Cardiovascular: No lower extremity edema, non tender, no erythema  Skin: Warm dry intact with no signs of infection or rash on extremities or on axial skeleton.  Abdomen: Soft nontender  Neuro: Cranial nerves II through XII are intact, neurovascularly intact in all extremities with 2+ DTRs and 2+ pulses.  Lymph: No lymphadenopathy of posterior or anterior cervical chain or axillae bilaterally.  Gait normal with good balance and coordination.  MSK:  Non tender with full range of motion and good stability and symmetric strength and tone of shoulders, elbows, wrist, hip, knee and ankles bilaterally.  Back Exam:  Inspection: Unremarkable  Motion: Flexion 45 deg, Extension 35 deg, Side Bending to 45 deg bilaterally,  Rotation to 45 deg bilaterally  SLR laying: Negative  XSLR laying: Negative  Palpable tenderness: Nontender over the coccyx with mild tenderness at the thoracolumbar junction FABER: Mild tightness bilaterally but no pain no significant range of motion. Sensory change: Gross sensation intact to all lumbar and sacral dermatomes.  Reflexes: 2+  at both patellar tendons, 2+ at achilles tendons, Babinski's downgoing.  Strength at foot  Plantar-flexion: 5/5 Dorsi-flexion: 5/5 Eversion: 5/5 Inversion: 5/5  Leg strength  Quad: 5/5 Hamstring: 5/5 Hip flexor: 5/5 Hip abductors: 5/5  Gait unremarkable.  Osteopathic  findings Cervical C2 flexed rotated and side bent left C7 flexed rotated and side bent right Thoracic T3 extended rotated and side bent right T7 extended rotated and side bent left Lumbar L2 flexed rotated and side bent left Sacrum Left on left  Ileum neutral today   Impression and Recommendations:     This case required medical decision making of moderate complexity.

## 2015-01-07 NOTE — Progress Notes (Signed)
Pre visit review using our clinic review tool, if applicable. No additional management support is needed unless otherwise documented below in the visit note. 

## 2015-01-07 NOTE — Patient Instructions (Signed)
Great to see you Mia Nguyen is your friend when needed You are doing everything right and do not change a thing.  Stay active and continue to work on core.  Drop run by about 5 minutes and do stretches at the end.  See me again in 4-6 weeks.

## 2015-02-05 ENCOUNTER — Ambulatory Visit: Payer: 59 | Admitting: Family Medicine

## 2015-02-05 ENCOUNTER — Telehealth: Payer: Self-pay | Admitting: Family Medicine

## 2015-02-05 NOTE — Telephone Encounter (Signed)
Patient called to apologized for no showing today.  I will cancel appointment so there is not a no show fee assessed.

## 2015-02-20 ENCOUNTER — Encounter: Payer: Self-pay | Admitting: Family Medicine

## 2015-02-20 ENCOUNTER — Ambulatory Visit (INDEPENDENT_AMBULATORY_CARE_PROVIDER_SITE_OTHER): Payer: 59 | Admitting: Family Medicine

## 2015-02-20 VITALS — BP 106/70 | HR 64 | Ht 63.0 in | Wt 119.0 lb

## 2015-02-20 DIAGNOSIS — M9903 Segmental and somatic dysfunction of lumbar region: Secondary | ICD-10-CM | POA: Diagnosis not present

## 2015-02-20 DIAGNOSIS — M9902 Segmental and somatic dysfunction of thoracic region: Secondary | ICD-10-CM | POA: Diagnosis not present

## 2015-02-20 DIAGNOSIS — M999 Biomechanical lesion, unspecified: Secondary | ICD-10-CM

## 2015-02-20 DIAGNOSIS — M9904 Segmental and somatic dysfunction of sacral region: Secondary | ICD-10-CM | POA: Diagnosis not present

## 2015-02-20 DIAGNOSIS — M533 Sacrococcygeal disorders, not elsewhere classified: Secondary | ICD-10-CM

## 2015-02-20 NOTE — Assessment & Plan Note (Signed)
Patient responded very well to conservative therapy at this point. We discussed icing regimen. We discussed that likely patient should not go so far with the interval treatment. We discussed topical anti-inflammatories as well as oral anti-inflammatories to do more on a scheduled maintenance when needed. Patient and will come back and see me again in 3-4 weeks for further evaluation and treatment.

## 2015-02-20 NOTE — Progress Notes (Signed)
[facility] [address] [place], [place] [address] Phone: [phone] Subjective:    CC: Sacral pain follow up  PJA:Mia Nguyen [name] is a 34 y.o. female coming in with complaint of sacral pain. Patient did have more of a coccygodynia. Patient did have osteopathic manipulation. Was doing well but exacerbation  Not having time to do exercises or work out More pain on the left side. No radiation .  No weakness. Patient states though that it seems to be much more uncomfortable than it has been multiple months.     Past Medical History  Diagnosis Date  . Pregnancy induced hypertension     post partum with last pregnancy  . SVD (spontaneous vaginal delivery) 07/30/2013  . Postpartum care following vaginal delivery (07/29/13) 04/20/2011  . History of headache 07/31/2013   No past surgical history on file. Allergies  Allergen Reactions  . Sulfa Antibiotics Other (See Comments)    "white heads all over"   Family History  Problem Relation Age of Onset  . Hypertension Maternal Grandmother   . Cancer Maternal Grandfather     prostate  . Cancer Cousin     breast  . Hypertension Mother   . Stroke Paternal Grandfather    Social History  Substance Use Topics  . Smoking status: Never Smoker   . Smokeless tobacco: Never Used  . Alcohol Use: No     Past medical history, social, surgical and family history all reviewed in electronic medical record.   Review of Systems: No headache, visual changes, nausea, vomiting, diarrhea, constipation, dizziness, abdominal pain, skin rash, fevers, chills, night sweats, weight loss, swollen lymph nodes, body aches, joint swelling, muscle aches, chest pain, shortness of breath, mood changes.   Objective Blood pressure 106/70, pulse 64, height 5\' 3"  (1.6 m), weight 119 lb (53.978 kg), SpO2 99 %, unknown if currently breastfeeding.  General: No apparent distress alert and oriented x3 mood and affect normal, dressed  appropriately.  HEENT: Pupils equal, extraocular movements intact  Respiratory: Patient's speak in full sentences and does not appear short of breath  Cardiovascular: No lower extremity edema, non tender, no erythema  Skin: Warm dry intact with no signs of infection or rash on extremities or on axial skeleton.  Abdomen: Soft nontender  Neuro: Cranial nerves II through XII are intact, neurovascularly intact in all extremities with 2+ DTRs and 2+ pulses.  Lymph: No lymphadenopathy of posterior or anterior cervical chain or axillae bilaterally.  Gait normal with good balance and coordination.  MSK:  Non tender with full range of motion and good stability and symmetric strength and tone of shoulders, elbows, wrist, hip, knee and ankles bilaterally.  Back Exam:  Inspection: Unremarkable  Motion: She has increasing stiffness but does have full range of motion compared to previous exams SLR laying: Negative  XSLR laying: Negative  Palpable tenderness: More paraspinal muscular tenderness than previous exam FABER: Mild tightness bilaterally but no pain no significant range of motion. Sensory change: Gross sensation intact to all lumbar and sacral dermatomes.  Reflexes: 2+ at both patellar tendons, 2+ at achilles tendons, Babinski's downgoing.  Strength at foot  Plantar-flexion: 5/5 Dorsi-flexion: 5/5 Eversion: 5/5 Inversion: 5/5  Leg strength  Quad: 5/5 Hamstring: 5/5 Hip flexor: 5/5 Hip abductors: 5/5  Gait unremarkable.  Osteopathic findings Cervical C2 flexed rotated and side bent left C7 flexed rotated and side bent right Thoracic T1 extended rotated and side bent left T3 extended rotated and side bent  right T7 extended rotated and side bent left Lumbar L2 flexed rotated and side bent left Sacrum Right on right which is different in previous exam     Impression and Recommendations:     This case required medical decision making of moderate complexity.

## 2015-02-20 NOTE — Progress Notes (Signed)
Pre visit review using our clinic review tool, if applicable. No additional management support is needed unless otherwise documented below in the visit note. 

## 2015-02-20 NOTE — Patient Instructions (Signed)
Great to see you Continue to keep working your tail off and focus a little on the stretches Ice please at night  Ibuprofen 600mg  3 times a day for 3 days when having flare Sometimes tennis shoes may help with chronic impact Good shoes with rigid bottom.  Jalene Mullet, [place] or New balance greater then 700 See me again in 3-4 weeks.

## 2015-02-20 NOTE — Assessment & Plan Note (Signed)
Decision today to treat with OMT was based on Physical Exam  After verbal consent patient was treated with HVLA, ME techniques in Cervical, thoracic, lumbar areas  Patient tolerated the procedure well with improvement in symptoms  Patient given exercises, stretches and lifestyle modifications  See medications in patient instructions if given  Patient will follow up in 4-6 weeks

## 2015-03-20 ENCOUNTER — Encounter: Payer: Self-pay | Admitting: Family Medicine

## 2015-03-20 ENCOUNTER — Ambulatory Visit (INDEPENDENT_AMBULATORY_CARE_PROVIDER_SITE_OTHER): Payer: 59 | Admitting: Family Medicine

## 2015-03-20 VITALS — BP 110/68 | HR 77 | Ht 63.0 in | Wt 117.0 lb

## 2015-03-20 DIAGNOSIS — M9904 Segmental and somatic dysfunction of sacral region: Secondary | ICD-10-CM | POA: Diagnosis not present

## 2015-03-20 DIAGNOSIS — M533 Sacrococcygeal disorders, not elsewhere classified: Secondary | ICD-10-CM | POA: Diagnosis not present

## 2015-03-20 DIAGNOSIS — M999 Biomechanical lesion, unspecified: Secondary | ICD-10-CM

## 2015-03-20 DIAGNOSIS — M9902 Segmental and somatic dysfunction of thoracic region: Secondary | ICD-10-CM | POA: Diagnosis not present

## 2015-03-20 DIAGNOSIS — M9903 Segmental and somatic dysfunction of lumbar region: Secondary | ICD-10-CM

## 2015-03-20 MED ORDER — TIZANIDINE HCL 2 MG PO TABS: 2.0000 mg | ORAL_TABLET | Freq: Three times a day (TID) | ORAL | Status: DC | PRN

## 2015-03-20 NOTE — Progress Notes (Signed)
[facility] [address] [place], [place] [address] Phone: [phone] Subjective:    CC: Sacral pain follow up  [id number] Mia Nguyen is a 34 y.o. female coming in with complaint of sacral pain. Patient did have more of a coccygodynia. Patient did have osteopathic manipulation. Was doing well but exacerbation  Not having time to do exercises or work out More pain on the left side. No radiation .  No weakness. Patient states that unfortunately she continues to have the discomfort. Was doing somewhat better after last manipulation and now increased in significant tightness mostly at night after a lot of activity. No radiation down the legs any numbness or tingling.     Past Medical History  Diagnosis Date  . Pregnancy induced hypertension     post partum with last pregnancy  . SVD (spontaneous vaginal delivery) 07/30/2013  . Postpartum care following vaginal delivery (07/29/13) 04/20/2011  . History of headache 07/31/2013   No past surgical history on file. Allergies  Allergen Reactions  . Sulfa Antibiotics Other (See Comments)    "white heads all over"   Family History  Problem Relation Age of Onset  . Hypertension Maternal Grandmother   . Cancer Maternal Grandfather     prostate  . Cancer Cousin     breast  . Hypertension Mother   . Stroke Paternal Grandfather    Social History  Substance Use Topics  . Smoking status: Never Smoker   . Smokeless tobacco: Never Used  . Alcohol Use: No     Past medical history, social, surgical and family history all reviewed in electronic medical record.   Review of Systems: No headache, visual changes, nausea, vomiting, diarrhea, constipation, dizziness, abdominal pain, skin rash, fevers, chills, night sweats, weight loss, swollen lymph nodes, body aches, joint swelling, muscle aches, chest pain, shortness of breath, mood changes.   Objective Blood pressure 110/68, pulse 77, height 5\' 3"  (1.6 m),  weight 117 lb (53.071 kg), SpO2 98 %, unknown if currently breastfeeding.  General: No apparent distress alert and oriented x3 mood and affect normal, dressed appropriately.  HEENT: Pupils equal, extraocular movements intact  Respiratory: Patient's speak in full sentences and does not appear short of breath  Cardiovascular: No lower extremity edema, non tender, no erythema  Skin: Warm dry intact with no signs of infection or rash on extremities or on axial skeleton.  Abdomen: Soft nontender  Neuro: Cranial nerves II through XII are intact, neurovascularly intact in all extremities with 2+ DTRs and 2+ pulses.  Lymph: No lymphadenopathy of posterior or anterior cervical chain or axillae bilaterally.  Gait normal with good balance and coordination.  MSK:  Non tender with full range of motion and good stability and symmetric strength and tone of shoulders, elbows, wrist, hip, knee and ankles bilaterally.  Back Exam:  Inspection: Unremarkable  Motion: Still increasing tightness of the hip flexors as well as hamstrings bilaterally. SLR laying: Negative  XSLR laying: Negative  Palpable tenderness: More tenderness over the sacroiliac joints bilaterally. Severe pain over the coccyx FABER: More pain than previously bilaterally Sensory change: Gross sensation intact to all lumbar and sacral dermatomes.  Reflexes: 2+ at both patellar tendons, 2+ at achilles tendons, Babinski's downgoing.  Strength at foot  Plantar-flexion: 5/5 Dorsi-flexion: 5/5 Eversion: 5/5 Inversion: 5/5  Leg strength  Quad: 5/5 Hamstring: 5/5 Hip flexor: 5/5 Hip abductors: 5/5  Gait unremarkable.  Osteopathic findings Cervical C2 flexed rotated and side bent left C7 flexed  rotated and side bent right Thoracic T1 extended rotated and side bent left T3 extended rotated and side bent right T7 extended rotated and side bent left Lumbar L2 flexed rotated and side bent left Sacrum Forward flexion bilaterally     Impression  and Recommendations:     This case required medical decision making of moderate complexity.

## 2015-03-20 NOTE — Assessment & Plan Note (Signed)
Decision today to treat with OMT was based on Physical Exam  After verbal consent patient was treated with HVLA, ME techniques in Cervical, thoracic, lumbar areas  Patient tolerated the procedure well with improvement in symptoms  Patient given exercises, stretches and lifestyle modifications  See medications in patient instructions if given  Patient will follow up in 3-4 weeks

## 2015-03-20 NOTE — Patient Instructions (Addendum)
Doing much better than last time.  Ice when you need it.  Continue the exercises as well.  New ones for the gluteus 3 times a week Continue the vitamins Call me on Monday if not better and will send in antibiotics.  Happy holidays! See me again in 3 weeks.

## 2015-03-20 NOTE — Progress Notes (Signed)
Pre visit review using our clinic review tool, if applicable. No additional management support is needed unless otherwise documented below in the visit note. 

## 2015-03-20 NOTE — Assessment & Plan Note (Signed)
Patient seems to be having worsening symptoms. I do think a muscle relaxer could be beneficial. Patient is having pain that seems to go radiating above the buttocks area and more on the coccyx at this time is likely more secondary to the sacrum. We discussed mobilization techniques again in greater detail. We discussed muscle relaxer and prescription given. Warned of potential side effects. Patient will try this and come back and see me again in 3-4 weeks for further evaluation. Responded well to osteopathic manipulation again.

## 2015-04-09 ENCOUNTER — Encounter: Payer: Self-pay | Admitting: Internal Medicine

## 2015-04-09 ENCOUNTER — Ambulatory Visit (INDEPENDENT_AMBULATORY_CARE_PROVIDER_SITE_OTHER): Payer: 59 | Admitting: Internal Medicine

## 2015-04-09 VITALS — BP 130/84 | HR 90 | Temp 97.6°F | Resp 20 | Wt 117.0 lb

## 2015-04-09 DIAGNOSIS — R300 Dysuria: Secondary | ICD-10-CM

## 2015-04-09 DIAGNOSIS — N342 Other urethritis: Secondary | ICD-10-CM

## 2015-04-09 DIAGNOSIS — H6503 Acute serous otitis media, bilateral: Secondary | ICD-10-CM | POA: Diagnosis not present

## 2015-04-09 DIAGNOSIS — Z23 Encounter for immunization: Secondary | ICD-10-CM

## 2015-04-09 LAB — POCT URINALYSIS DIPSTICK
Bilirubin, UA: NEGATIVE
GLUCOSE UA: NEGATIVE
Leukocytes, UA: NEGATIVE
NITRITE UA: NEGATIVE
Protein, UA: NEGATIVE
Spec Grav, UA: 1.02
UROBILINOGEN UA: 0.2
pH, UA: 6

## 2015-04-09 MED ORDER — CIPROFLOXACIN HCL 500 MG PO TABS: 500.0000 mg | ORAL_TABLET | Freq: Two times a day (BID) | ORAL | Status: DC

## 2015-04-09 NOTE — Patient Instructions (Addendum)
Take Cipro 500 mg twice daily for 3 days. Urine has not been cultured. Flu vaccine given. Try Flonase nasal spray 2 sprays in each nostril daily for serous otitis media. If this does not help, consider allergy testing. Flu vaccine given.

## 2015-04-09 NOTE — Progress Notes (Signed)
   Subjective:    Patient ID: Mia Nguyen, female    DOB: 1981/04/25, 34 y.o.   MRN: [id number]  HPI Feels that bladder is not completely emptying. No significant dysuria but some discomfort at end of stream. Is sexually active. No blood in urine. No fever or shaking chills. No back pain.  Also feeling that  ears are stuffy. Has had recurrent issues with serous otitis media. Has never been allergy tested. Has not tried Flonase nasal spray.    Review of Systems     Objective:   Physical Exam No CVA tenderness. Urinalysis unremarkable. Culture not done. TMs are full bilaterally but not red.       Assessment & Plan:  Urethritis  Bilateral serous otitis media  Plan: Suggest Flonase nasal spray 2 sprays in each nostril daily. If no relief in 2-3 weeks consider allergy testing. For urethritis Cipro 500 mg twice daily for 3 days. Urine has not been cultured.  Flu vaccine given.

## 2015-04-10 ENCOUNTER — Ambulatory Visit: Payer: 59 | Admitting: Family Medicine

## 2015-04-14 ENCOUNTER — Encounter: Payer: Self-pay | Admitting: Family Medicine

## 2015-04-14 ENCOUNTER — Ambulatory Visit (INDEPENDENT_AMBULATORY_CARE_PROVIDER_SITE_OTHER): Payer: 59 | Admitting: Family Medicine

## 2015-04-14 ENCOUNTER — Ambulatory Visit: Payer: 59 | Admitting: Family Medicine

## 2015-04-14 VITALS — BP 122/82 | HR 60 | Ht 63.0 in | Wt 118.0 lb

## 2015-04-14 DIAGNOSIS — M533 Sacrococcygeal disorders, not elsewhere classified: Secondary | ICD-10-CM

## 2015-04-14 DIAGNOSIS — M9903 Segmental and somatic dysfunction of lumbar region: Secondary | ICD-10-CM | POA: Diagnosis not present

## 2015-04-14 DIAGNOSIS — M9904 Segmental and somatic dysfunction of sacral region: Secondary | ICD-10-CM | POA: Diagnosis not present

## 2015-04-14 DIAGNOSIS — M9902 Segmental and somatic dysfunction of thoracic region: Secondary | ICD-10-CM

## 2015-04-14 DIAGNOSIS — M999 Biomechanical lesion, unspecified: Secondary | ICD-10-CM

## 2015-04-14 NOTE — Progress Notes (Signed)
Pre visit review using our clinic review tool, if applicable. No additional management support is needed unless otherwise documented below in the visit note. 

## 2015-04-14 NOTE — Assessment & Plan Note (Signed)
Seems to be resolving even with patient being somewhat noncompliant. We discussed icing regimen, home exercises, we discussed muscle relaxer needed. Continues respond well to osteopathic manipulation. Patient will continue to be active. I would like to see her though again in 3-4 weeks. Discussed the importance of making time for herself.

## 2015-04-14 NOTE — Patient Instructions (Signed)
Great to see you Time to take care of yourself Vitamin D 2000 IU daily Turmeric 500mg  twice daily On wall with heels, butt shoulder and head touching for a goal of 5 minutes daily Before bed.  I want to see you again in 3-4 weeks Happy holidays!

## 2015-04-14 NOTE — Assessment & Plan Note (Signed)
Decision today to treat with OMT was based on Physical Exam  After verbal consent patient was treated with HVLA, ME techniques in Cervical, thoracic, lumbar areas  Patient tolerated the procedure well with improvement in symptoms  Patient given exercises, stretches and lifestyle modifications  See medications in patient instructions if given  Patient will follow up in 3-4 weeks

## 2015-04-14 NOTE — Progress Notes (Signed)
[facility] [address] [place], [place] [address] Phone: [phone] Subjective:    CC: Sacral pain follow up  [id number] Mia Nguyen is a 34 y.o. female coming in with complaint of sacral pain. Patient did have more of a coccygodynia. Patient did have osteopathic manipulation. Was doing well but exacerbation  Not having time to do exercises or work out More pain on the left side. No radiation .  No weakness. Patient though is not doing the exercises on a regular basis. Patient has not been taking care of herself. Patient does have some small children. Has been focusing on them. Denies any numbness or tingling. Rates the severity of discomfort as still improved from previous exam. Patient notices though that her core is getting weaker and is looking for motivation to do more exercises or working out.      Past Medical History  Diagnosis Date  . Pregnancy induced hypertension     post partum with last pregnancy  . SVD (spontaneous vaginal delivery) 07/30/2013  . Postpartum care following vaginal delivery (07/29/13) 04/20/2011  . History of headache 07/31/2013   No past surgical history on file. Allergies  Allergen Reactions  . Sulfa Antibiotics Other (See Comments)    "white heads all over"   Family History  Problem Relation Age of Onset  . Hypertension Maternal Grandmother   . Cancer Maternal Grandfather     prostate  . Cancer Cousin     breast  . Hypertension Mother   . Stroke Paternal Grandfather    Social History  Substance Use Topics  . Smoking status: Never Smoker   . Smokeless tobacco: Never Used  . Alcohol Use: No     Past medical history, social, surgical and family history all reviewed in electronic medical record.   Review of Systems: No headache, visual changes, nausea, vomiting, diarrhea, constipation, dizziness, abdominal pain, skin rash, fevers, chills, night sweats, weight loss, swollen lymph nodes, body aches, joint  swelling, muscle aches, chest pain, shortness of breath, mood changes.   Objective Blood pressure 122/82, pulse 60, height 5\' 3"  (1.6 m), weight 118 lb (53.524 kg), last menstrual period 03/25/2015, SpO2 98 %, unknown if currently breastfeeding.  General: No apparent distress alert and oriented x3 mood and affect normal, dressed appropriately.  HEENT: Pupils equal, extraocular movements intact  Respiratory: Patient's speak in full sentences and does not appear short of breath  Cardiovascular: No lower extremity edema, non tender, no erythema  Skin: Warm dry intact with no signs of infection or rash on extremities or on axial skeleton.  Abdomen: Soft nontender  Neuro: Cranial nerves II through XII are intact, neurovascularly intact in all extremities with 2+ DTRs and 2+ pulses.  Lymph: No lymphadenopathy of posterior or anterior cervical chain or axillae bilaterally.  Gait normal with good balance and coordination.  MSK:  Non tender with full range of motion and good stability and symmetric strength and tone of shoulders, elbows, wrist, hip, knee and ankles bilaterally.  Back Exam:  Inspection: Unremarkable  Motion: Continued tenderness of the hamstrings. SLR laying: Negative  XSLR laying: Negative  Palpable tenderness: Significant decrease in pain over the coccyx from previous exam. Still mild discomfort over the sacroiliac joints. FABER: More pain than previously bilaterally Sensory change: Gross sensation intact to all lumbar and sacral dermatomes.  Reflexes: 2+ at both patellar tendons, 2+ at achilles tendons, Babinski's downgoing.  Strength at foot  Plantar-flexion: 5/5 Dorsi-flexion: 5/5 Eversion: 5/5 Inversion: 5/5  Leg strength  Quad: 5/5 Hamstring: 5/5 Hip flexor: 5/5 Hip abductors: 5/5  Gait unremarkable.  Osteopathic findings Cervical C2 flexed rotated and side bent left C6 flexed rotated and side bent right Thoracic T1 extended rotated and side bent left T3 extended  rotated and side bent right Lumbar L2 flexed rotated and side bent left Sacrum Right on right  Left anterior ilium     Impression and Recommendations:     This case required medical decision making of moderate complexity.

## 2015-05-06 ENCOUNTER — Telehealth: Payer: Self-pay

## 2015-05-06 NOTE — Telephone Encounter (Signed)
Patient states that she is still having fluid problems in her ears- despite using flonase. ? Referral to ENT or Allergist Patient states that she jammed her thumb swinging golfclub back at Thanksgiving and wants to know if you have any recommendation for how to help heal this. She states that she is able to use the thumb but she is still having pain in it.  CB# [phone]

## 2015-05-07 NOTE — Telephone Encounter (Signed)
Needs OV next week

## 2015-05-08 NOTE — Telephone Encounter (Signed)
Left message advising patient to contact office for appointment

## 2015-05-12 ENCOUNTER — Ambulatory Visit: Payer: 59 | Admitting: Family Medicine

## 2015-12-01 ENCOUNTER — Encounter: Payer: Self-pay | Admitting: Internal Medicine

## 2015-12-01 ENCOUNTER — Ambulatory Visit (INDEPENDENT_AMBULATORY_CARE_PROVIDER_SITE_OTHER): Payer: 59 | Admitting: Internal Medicine

## 2015-12-01 VITALS — BP 118/74 | HR 72 | Temp 97.3°F | Ht 63.0 in | Wt 125.0 lb

## 2015-12-01 DIAGNOSIS — J069 Acute upper respiratory infection, unspecified: Secondary | ICD-10-CM

## 2015-12-01 DIAGNOSIS — H6502 Acute serous otitis media, left ear: Secondary | ICD-10-CM

## 2015-12-01 MED ORDER — AZITHROMYCIN 250 MG PO TABS: ORAL_TABLET | ORAL | 0 refills | Status: DC

## 2015-12-01 MED ORDER — FLUCONAZOLE 150 MG PO TABS: 150.0000 mg | ORAL_TABLET | Freq: Once | ORAL | 0 refills | Status: AC

## 2015-12-01 NOTE — Progress Notes (Signed)
   Subjective:    Patient ID: Mia Nguyen, female    DOB: 1980-05-03, 35 y.o.   MRN: [id number]  HPI   2 day history of URI symptoms with cough. Slight green sputum production. No fever or chills. Leaving later in the week to go to [place] for 10 days. Will be on a boat.    Review of Systems see above     Objective:   Physical Exam Skin warm and dry. She has acute left otitis media with left TM being full but not red. Right TM is normal. Pharynx is clear. Neck supple. Chest clear to auscultation without rales or wheezing       Assessment & Plan:  Acute left serous otitis media  Acute URI  Plan: Zithromax Z-Pak take 2 tablets day one followed by 1 tablet days 2 through 5. May take Delsym for cough. Continue Claritin-D. As a precaution, Diflucan 150 mg tablet to have on hand should Candida vaginitis developed while on trip

## 2015-12-01 NOTE — Patient Instructions (Addendum)
Zithromax Z pak 2 po day 1 followed by one po days 2-5. Diflucan as a precaution if needed for Candida vaginitis. Delsym if needed for cough. Claritin-D daily.

## 2016-02-08 ENCOUNTER — Ambulatory Visit: Payer: 59 | Admitting: Internal Medicine

## 2016-06-21 DIAGNOSIS — L738 Other specified follicular disorders: Secondary | ICD-10-CM | POA: Diagnosis not present

## 2016-06-21 DIAGNOSIS — L740 Miliaria rubra: Secondary | ICD-10-CM | POA: Diagnosis not present

## 2016-09-12 DIAGNOSIS — D1801 Hemangioma of skin and subcutaneous tissue: Secondary | ICD-10-CM | POA: Diagnosis not present

## 2016-09-12 DIAGNOSIS — D2261 Melanocytic nevi of right upper limb, including shoulder: Secondary | ICD-10-CM | POA: Diagnosis not present

## 2016-09-12 DIAGNOSIS — D2262 Melanocytic nevi of left upper limb, including shoulder: Secondary | ICD-10-CM | POA: Diagnosis not present

## 2017-01-03 DIAGNOSIS — Z6821 Body mass index (BMI) 21.0-21.9, adult: Secondary | ICD-10-CM | POA: Diagnosis not present

## 2017-01-03 DIAGNOSIS — Z01419 Encounter for gynecological examination (general) (routine) without abnormal findings: Secondary | ICD-10-CM | POA: Diagnosis not present

## 2017-02-13 DIAGNOSIS — R1032 Left lower quadrant pain: Secondary | ICD-10-CM | POA: Diagnosis not present

## 2017-02-13 DIAGNOSIS — Z13220 Encounter for screening for lipoid disorders: Secondary | ICD-10-CM | POA: Diagnosis not present

## 2017-02-13 DIAGNOSIS — Z1329 Encounter for screening for other suspected endocrine disorder: Secondary | ICD-10-CM | POA: Diagnosis not present

## 2017-02-13 DIAGNOSIS — Z0000 Encounter for general adult medical examination without abnormal findings: Secondary | ICD-10-CM | POA: Diagnosis not present

## 2017-02-13 DIAGNOSIS — Z130 Encounter for screening for diseases of the blood and blood-forming organs and certain disorders involving the immune mechanism: Secondary | ICD-10-CM | POA: Diagnosis not present

## 2017-03-06 DIAGNOSIS — Z23 Encounter for immunization: Secondary | ICD-10-CM | POA: Diagnosis not present

## 2017-09-06 DIAGNOSIS — Z3202 Encounter for pregnancy test, result negative: Secondary | ICD-10-CM | POA: Diagnosis not present

## 2017-09-06 DIAGNOSIS — R350 Frequency of micturition: Secondary | ICD-10-CM | POA: Diagnosis not present

## 2018-01-22 DIAGNOSIS — Z3202 Encounter for pregnancy test, result negative: Secondary | ICD-10-CM | POA: Diagnosis not present

## 2018-01-22 DIAGNOSIS — N390 Urinary tract infection, site not specified: Secondary | ICD-10-CM | POA: Diagnosis not present

## 2018-01-23 ENCOUNTER — Telehealth: Payer: Self-pay | Admitting: Internal Medicine

## 2018-01-23 ENCOUNTER — Emergency Department (HOSPITAL_COMMUNITY): Admission: EM | Admit: 2018-01-23 | Discharge: 2018-01-23 | Discharge status: Against Medical Advice | Payer: 59

## 2018-01-23 ENCOUNTER — Emergency Department (HOSPITAL_COMMUNITY)
Admission: EM | Admit: 2018-01-23 | Discharge: 2018-01-23 | Disposition: A | Discharge status: Home / Self Care | Payer: 59 | Attending: Emergency Medicine | Admitting: Emergency Medicine

## 2018-01-23 ENCOUNTER — Encounter (HOSPITAL_COMMUNITY): Payer: Self-pay | Admitting: Emergency Medicine

## 2018-01-23 ENCOUNTER — Other Ambulatory Visit: Payer: Self-pay | Admitting: Obstetrics and Gynecology

## 2018-01-23 ENCOUNTER — Emergency Department (HOSPITAL_COMMUNITY): Payer: 59

## 2018-01-23 DIAGNOSIS — R350 Frequency of micturition: Secondary | ICD-10-CM

## 2018-01-23 DIAGNOSIS — N200 Calculus of kidney: Secondary | ICD-10-CM | POA: Diagnosis not present

## 2018-01-23 DIAGNOSIS — R1031 Right lower quadrant pain: Secondary | ICD-10-CM | POA: Diagnosis not present

## 2018-01-23 DIAGNOSIS — R109 Unspecified abdominal pain: Secondary | ICD-10-CM

## 2018-01-23 LAB — HEPATIC FUNCTION PANEL
ALBUMIN: 4.1 g/dL (ref 3.5–5.0)
ALT: 14 U/L (ref 0–44)
AST: 20 U/L (ref 15–41)
Alkaline Phosphatase: 37 U/L — ABNORMAL LOW (ref 38–126)
BILIRUBIN TOTAL: 0.6 mg/dL (ref 0.3–1.2)
Bilirubin, Direct: 0.1 mg/dL (ref 0.0–0.2)
Indirect Bilirubin: 0.5 mg/dL (ref 0.3–0.9)
Total Protein: 7.1 g/dL (ref 6.5–8.1)

## 2018-01-23 LAB — URINALYSIS, ROUTINE W REFLEX MICROSCOPIC
Bilirubin Urine: NEGATIVE
Glucose, UA: NEGATIVE mg/dL
Ketones, ur: 5 mg/dL — AB
Leukocytes, UA: NEGATIVE
Nitrite: NEGATIVE
Protein, ur: NEGATIVE mg/dL
Specific Gravity, Urine: 1.002 — ABNORMAL LOW (ref 1.005–1.030)
pH: 7 (ref 5.0–8.0)

## 2018-01-23 LAB — BASIC METABOLIC PANEL
Anion gap: 10 (ref 5–15)
BUN: 10 mg/dL (ref 6–20)
CO2: 24 mmol/L (ref 22–32)
Calcium: 8.9 mg/dL (ref 8.9–10.3)
Chloride: 102 mmol/L (ref 98–111)
Creatinine, Ser: 0.83 mg/dL (ref 0.44–1.00)
GFR calc Af Amer: >60 mL/min (ref >60)
GFR calc non Af Amer: >60 mL/min (ref >60)
GLUCOSE: 257 mg/dL — AB (ref 70–99)
Potassium: 3.3 mmol/L — ABNORMAL LOW (ref 3.5–5.1)
Sodium: 136 mmol/L (ref 135–145)

## 2018-01-23 LAB — CBC
HCT: 40 % (ref 36.0–46.0)
HEMOGLOBIN: 13.3 g/dL (ref 12.0–15.0)
MCH: 31.4 pg (ref 26.0–34.0)
MCHC: 33.3 g/dL (ref 30.0–36.0)
MCV: 94.3 fL (ref 78.0–100.0)
PLATELETS: 281 10*3/uL (ref 150–400)
RBC: 4.24 MIL/uL (ref 3.87–5.11)
RDW: 13.1 % (ref 11.5–15.5)
WBC: 14.4 10*3/uL — ABNORMAL HIGH (ref 4.0–10.5)

## 2018-01-23 LAB — LIPASE, BLOOD: LIPASE: 43 U/L (ref 11–51)

## 2018-01-23 LAB — POC URINE PREG, ED: PREG TEST UR: NEGATIVE

## 2018-01-23 MED ORDER — CEPHALEXIN 500 MG PO CAPS: 500.0000 mg | ORAL_CAPSULE | Freq: Four times a day (QID) | ORAL | 0 refills | Status: DC

## 2018-01-23 MED ORDER — ONDANSETRON HCL 4 MG/2ML IJ SOLN
4.0000 mg | Freq: Once | INTRAMUSCULAR | Status: AC
  Administered 2018-01-23: 4 mg via INTRAVENOUS
  Filled 2018-01-23: qty 2

## 2018-01-23 MED ORDER — FENTANYL CITRATE (PF) 100 MCG/2ML IJ SOLN
50.0000 ug | Freq: Once | INTRAMUSCULAR | Status: AC
  Administered 2018-01-23: 50 ug via INTRAVENOUS
  Filled 2018-01-23: qty 2

## 2018-01-23 MED ORDER — HYDROCODONE-ACETAMINOPHEN 5-325 MG PO TABS: 1.0000 | ORAL_TABLET | Freq: Four times a day (QID) | ORAL | 0 refills | Status: AC | PRN

## 2018-01-23 MED ORDER — LACTATED RINGERS IV BOLUS
1000.0000 mL | Freq: Once | INTRAVENOUS | Status: AC
  Administered 2018-01-23: 1000 mL via INTRAVENOUS

## 2018-01-23 MED ORDER — ONDANSETRON HCL 4 MG PO TABS: 4.0000 mg | ORAL_TABLET | Freq: Four times a day (QID) | ORAL | 0 refills | Status: DC

## 2018-01-23 NOTE — ED Provider Notes (Signed)
[facility] DEPT Provider Note   CSN: [id number] Arrival date & time: 01/23/18  1455     History   Chief Complaint Chief Complaint  Patient presents with  . Back Pain  . Urinary Tract Infection    HPI Mia Nguyen is a 37 y.o. female.  The history is provided by the patient.  Abdominal Pain   This is a new problem. The current episode started more than 2 days ago. The problem occurs daily. The problem has not changed since onset.The pain is associated with an unknown (Started on macrobid for UTI yesterday, sent from PCP for concern for kidney stone.) factor. The pain is located in the RUQ (right cva). The quality of the pain is aching and dull. The pain is at a severity of 6/10. The pain is moderate. Associated symptoms include nausea, dysuria and frequency. Pertinent negatives include anorexia, fever, belching, diarrhea, flatus, hematochezia, melena, vomiting, constipation, hematuria and arthralgias. The symptoms are aggravated by certain positions. Nothing relieves the symptoms. Past workup does not include ultrasound or surgery. Her past medical history does not include gallstones or GERD.    Past Medical History:  Diagnosis Date  . History of headache 07/31/2013  . Postpartum care following vaginal delivery (07/29/13) 04/20/2011  . Pregnancy induced hypertension    post partum with last pregnancy  . SVD (spontaneous vaginal delivery) 07/30/2013    Patient Active Problem List   Diagnosis Date Noted  . Coccydynia 10/17/2014  . Nonallopathic lesion of sacral region 10/17/2014  . Nonallopathic lesion of thoracic region 10/17/2014  . Nonallopathic lesion of lumbosacral region 10/17/2014  . Umbilical hernia 50/12/3816  . History of headache 07/31/2013  . SVD (spontaneous vaginal delivery) 07/30/2013  . Postpartum care following vaginal delivery (07/29/13) 04/20/2011    History reviewed. No pertinent surgical history.   OB History    Gravida  3     Para  3   Term  3   Preterm      AB      Living  3     SAB      TAB      Ectopic      Multiple      Live Births  3            Home Medications    Prior to Admission medications   Medication Sig Start Date End Date Taking? Authorizing Provider  ibuprofen (ADVIL,MOTRIN) 200 MG tablet Take 400 mg by mouth every 4 (four) hours as needed for moderate pain.   Yes [provider]  nitrofurantoin (MACRODANTIN) 100 MG capsule Take 100 mg by mouth 2 (two) times daily.   Yes [provider]  azithromycin (ZITHROMAX) 250 MG tablet 2 po day 1 followed by one po days 2-5 Patient not taking: Reported on 01/23/2018 12/01/15   Elby Showers, MD  HYDROcodone-acetaminophen (NORCO/VICODIN) 5-325 MG tablet Take 1 tablet by mouth every 6 (six) hours as needed for up to 5 days for severe pain. 01/23/18 01/28/18  Nel Stoneking, DO  ondansetron (ZOFRAN) 4 MG tablet Take 1 tablet (4 mg total) by mouth every 6 (six) hours. 01/23/18   Lennice Sites, DO    Family History Family History  Problem Relation Age of Onset  . Hypertension Mother   . Hypertension Maternal Grandmother   . Cancer Maternal Grandfather        prostate  . Cancer Cousin        breast  . Stroke Paternal  Grandfather     Social History Social History   Tobacco Use  . Smoking status: Never Smoker  . Smokeless tobacco: Never Used  Substance Use Topics  . Alcohol use: Yes  . Drug use: No     Allergies   Sulfa antibiotics   Review of Systems Review of Systems  Constitutional: Negative for chills and fever.  HENT: Negative for ear pain and sore throat.   Eyes: Negative for pain and visual disturbance.  Respiratory: Negative for cough and shortness of breath.   Cardiovascular: Negative for chest pain and palpitations.  Gastrointestinal: Positive for abdominal pain and nausea. Negative for anorexia, constipation, diarrhea, flatus, hematochezia, melena and vomiting.  Genitourinary: Positive for  dysuria and frequency. Negative for hematuria.  Musculoskeletal: Negative for arthralgias and back pain.  Skin: Negative for color change and rash.  Neurological: Negative for seizures and syncope.  All other systems reviewed and are negative.    Physical Exam Updated Vital Signs  ED Triage Vitals  Enc Vitals Group     BP 01/23/18 1517 (!) 151/91     Pulse Rate 01/23/18 1517 96     Resp 01/23/18 1517 16     Temp 01/23/18 1517 99.9 F (37.7 C)     Temp Source 01/23/18 1517 Oral     SpO2 01/23/18 1517 100 %     Weight 01/23/18 1517 123 lb (55.8 kg)     Height 01/23/18 1517 5\' 3"  (1.6 m)     Head Circumference --      Peak Flow --      Pain Score 01/23/18 1525 7     Pain Loc --      Pain Edu? --      Excl. in [place]? --     Physical Exam  Constitutional: She appears well-developed and well-nourished. No distress.  HENT:  Head: Normocephalic and atraumatic.  Eyes: Pupils are equal, round, and reactive to light. Conjunctivae and EOM are normal.  Neck: Normal range of motion. Neck supple.  Cardiovascular: Normal rate, regular rhythm, normal heart sounds and intact distal pulses.  No murmur heard. Pulmonary/Chest: Effort normal and breath sounds normal. No respiratory distress.  Abdominal: Soft. Bowel sounds are normal. There is tenderness (TTP in RUQ, right CVA area).  Musculoskeletal: Normal range of motion. She exhibits no edema.  Neurological: She is alert.  Skin: Skin is warm and dry. Capillary refill takes less than 2 seconds.  Psychiatric: She has a normal mood and affect.  Nursing note and vitals reviewed.    ED Treatments / Results  Labs (all labs ordered are listed, but only abnormal results are displayed) Labs Reviewed  URINALYSIS, ROUTINE W REFLEX MICROSCOPIC - Abnormal; Notable for the following components:      Result Value   Color, Urine STRAW (*)    Specific Gravity, Urine 1.002 (*)    Hgb urine dipstick MODERATE (*)    Ketones, ur 5 (*)    Bacteria, UA  RARE (*)    All other components within normal limits  BASIC METABOLIC PANEL - Abnormal; Notable for the following components:   Potassium 3.3 (*)    Glucose, Bld 257 (*)    All other components within normal limits  CBC - Abnormal; Notable for the following components:   WBC 14.4 (*)    All other components within normal limits  HEPATIC FUNCTION PANEL - Abnormal; Notable for the following components:   Alkaline Phosphatase 37 (*)    All other components within  normal limits  URINE CULTURE  LIPASE, BLOOD  POC URINE PREG, ED    EKG None  Radiology Ct Renal Stone Study  Result Date: 01/23/2018 CLINICAL DATA:  Right flank pain EXAM: CT ABDOMEN AND PELVIS WITHOUT CONTRAST TECHNIQUE: Multidetector CT imaging of the abdomen and pelvis was performed following the standard protocol without IV contrast. COMPARISON:  None. FINDINGS: Lower chest: No acute abnormality Hepatobiliary: No focal hepatic abnormality. Gallbladder unremarkable. Pancreas: No focal abnormality or ductal dilatation. Spleen: No focal abnormality.  Normal size. Adrenals/Urinary Tract: Bilateral nonobstructing renal stones, the largest in the right midpole measuring 5 mm. No hydronephrosis. Slight perinephric stranding around the right kidney. There is a coarse cm stone layering dependently in the right side of the bladder, likely recently passed stone. Adrenal glands unremarkable. Stomach/Bowel: Normal appendix. Stomach, large and small bowel grossly unremarkable. Vascular/Lymphatic: No evidence of aneurysm or adenopathy. Reproductive: 2.5 cm functional cyst or follicle in the right ovary. Uterus and left adnexa unremarkable. Other: No free fluid or free air. Musculoskeletal: No acute bony abnormality. IMPRESSION: Bilateral nonobstructing nephrolithiasis. Slight perinephric stranding around the right kidney without overt hydronephrosis. There is a 4 mm stone layering dependently in the right side of the urinary bladder, likely  recently passed stone. Electronically Signed   By: Rolm Baptise M.D.   On: 01/23/2018 19:04    Procedures Procedures (including critical care time)  Medications Ordered in ED Medications  fentaNYL (SUBLIMAZE) injection 50 mcg (50 mcg Intravenous Given 01/23/18 1914)  lactated ringers bolus 1,000 mL (1,000 mLs Intravenous New Bag/Given 01/23/18 1913)  ondansetron (ZOFRAN) injection 4 mg (4 mg Intravenous Given 01/23/18 1914)     Initial Impression / Assessment and Plan / ED Course  I have reviewed the triage vital signs and the nursing notes.  Pertinent labs & imaging results that were available during my care of the patient were reviewed by me and considered in my medical decision making (see chart for details).     Mia Nguyen is a 37 year old female with no significant medical history who presents to the ED with right flank pain.  Patient with unremarkable vitals.  Diagnosed with urinary tract infection yesterday and started on Macrobid.  Patient continues to have right flank pain right upper quadrant abdominal pain.  Was sent from primary doctor's office to rule out kidney stone.  Patient does not have history of kidney stones.  Patient has had some nausea but no vomiting.  Patient has tenderness in the right upper quadrant area as well as right CVA.  Will obtain lab work, CT scan to evaluate for kidney stone, other intra-abdominal process.  Patient with mild leukocytosis but otherwise unremarkable CBC.  No significant anemia.  No significant electrolyte abnormality, kidney injury.  Urinalysis unremarkable.  Urine culture sent for.  Pregnancy test negative.  Patient was CT scan that shows bilateral nonobstructing kidney stones.  She has no obvious hydronephrosis.  She has 4 mm stone within the urinary bladder.  Likely has passed kidney stone recently.  Gallbladder and liver enzymes within normal limits.  Patient switched over to Keflex.  Given information to follow-up with urology.  Given  prescription for Norco for pain.  Given Zofran as well.  Patient has no active narcotic scripts in [employer].  Discharged in ED good condition.  Recommend follow-up with primary care doctor and told to return to ED if symptoms worsen.  This chart was dictated using voice recognition software.  Despite best efforts to proofread, errors can  occur which can change the documentation meaning.  Final Clinical Impressions(s) / ED Diagnoses   Final diagnoses:  Kidney stone    ED Discharge Orders         Ordered    ondansetron (ZOFRAN) 4 MG tablet  Every 6 hours     01/23/18 2047    HYDROcodone-acetaminophen (NORCO/VICODIN) 5-325 MG tablet  Every 6 hours PRN     01/23/18 2047           Lennice Sites, DO 01/23/18 2048

## 2018-01-23 NOTE — ED Triage Notes (Signed)
Per pt, she was placed on an antibiotic yesterday for a UTI-states lower back pain, chills-has taken 3 doses with no relief-PCP thought she might have a kidney stone-no history of stones

## 2018-01-23 NOTE — ED Notes (Signed)
Patient transported to CT 

## 2018-01-23 NOTE — Telephone Encounter (Signed)
Needs to stay at ED and be evaluated.

## 2018-01-23 NOTE — Telephone Encounter (Signed)
Patient called yesterday with symptoms of UTI, but we did not have appointment until today, so she went to [facility].  They started her on antibiotics.  However, she has continued to have pain on right lower back with some nausea.  She called them back today and they advised her to go to the ED.  She just called here because the wait is 5 hours at [facility].    I went through the symptoms of kidney stones with her.  She has fever, lethargy, pain about 2-3 (doesn't describe as sharpe, stabbing pain), no hematuria that she can see.    She was going to leave [facility] ED and go to [facility] ED.  She wanted to see if you had any suggestions for her.  I told her that it sounded like she probably did need a CT Scan, but she had to have an order from a doctor in order to get that completed.  I told her that we prefer to see patients in the mornings when we are ordering them because we can't get them pre-certed and completed in the afternoons, so her best bet was to wait there in the ED until I could speak with you and call her back.

## 2018-01-23 NOTE — ED Notes (Signed)
Pt states she is not willing to wait. Seen leaving ED with steady.

## 2018-01-23 NOTE — Telephone Encounter (Signed)
Spoke with patient and provided message from Dr. Renold Genta.  Patient verbalized understanding of message.  She will sit tight at [facility] ED and wait to be seen and evaluated.

## 2018-01-23 NOTE — ED Notes (Signed)
Bed: WA04 Expected date:  Expected time:  Means of arrival:  Comments: Hold for [employer]

## 2018-01-24 ENCOUNTER — Telehealth: Payer: Self-pay | Admitting: Internal Medicine

## 2018-01-24 NOTE — Telephone Encounter (Signed)
Spoke with patient in follow up to her ED Discharge on 9/24; Dr. Renold Genta would like to see patient next week in follow up.  Made appointment for 9/30 @ 12:00 noon.  Patient confirmed.  Patient was confirmed to have multiple kidney stones.  She has not passed that she is aware of; however she was not given a strainer to strain her urine either.  She will come by on Thursday and pick up a strainer.  She was advised to follow up with a Urologist.  She has a friend who is a GI Doc; she thought we were closed today, so she contacted her GYN office and they were putting in the referral for her to the Urologist.  Her GI Doc (friend) is notifying the Urologist of choice and trying to get that ball rolling for the patient so that she can get in to be seen sooner rather than later.    Patient states that she feels the worst is behind her.  She is on hydrocodone pain medication at the present time.

## 2018-01-25 DIAGNOSIS — B9620 Unspecified Escherichia coli [E. coli] as the cause of diseases classified elsewhere: Secondary | ICD-10-CM | POA: Diagnosis not present

## 2018-01-25 DIAGNOSIS — N202 Calculus of kidney with calculus of ureter: Secondary | ICD-10-CM | POA: Diagnosis not present

## 2018-01-25 DIAGNOSIS — N390 Urinary tract infection, site not specified: Secondary | ICD-10-CM | POA: Diagnosis not present

## 2018-01-25 LAB — URINE CULTURE: CULTURE: NO GROWTH

## 2018-01-26 ENCOUNTER — Other Ambulatory Visit: Payer: 59

## 2018-01-29 ENCOUNTER — Ambulatory Visit: Payer: 59 | Admitting: Internal Medicine

## 2018-01-29 ENCOUNTER — Encounter: Payer: Self-pay | Admitting: Internal Medicine

## 2018-01-29 VITALS — BP 120/90 | HR 63 | Temp 98.1°F | Ht 63.0 in | Wt 122.0 lb

## 2018-01-29 DIAGNOSIS — N200 Calculus of kidney: Secondary | ICD-10-CM

## 2018-01-29 DIAGNOSIS — N390 Urinary tract infection, site not specified: Secondary | ICD-10-CM

## 2018-01-29 LAB — POCT URINALYSIS DIPSTICK
APPEARANCE: NORMAL
Bilirubin, UA: NEGATIVE
Blood, UA: NEGATIVE
GLUCOSE UA: NEGATIVE
KETONES UA: NEGATIVE
Leukocytes, UA: NEGATIVE
Nitrite, UA: NEGATIVE
Odor: NORMAL
Protein, UA: NEGATIVE
Spec Grav, UA: 1.01 (ref 1.010–1.025)
UROBILINOGEN UA: 0.2 U/dL
pH, UA: 6.5 (ref 5.0–8.0)

## 2018-01-29 MED ORDER — TAMSULOSIN HCL 0.4 MG PO CAPS: 0.4000 mg | ORAL_CAPSULE | Freq: Every day | ORAL | 3 refills | Status: DC

## 2018-01-29 NOTE — Patient Instructions (Signed)
Finish Augmentin prescription. Follow up with Urologist in 6 months.

## 2018-01-29 NOTE — Progress Notes (Signed)
   Subjective:    Patient ID: Mia Nguyen, female    DOB: 18-Apr-1981, 37 y.o.   MRN: [id number]  HPI Pt was diagnosesd with bilateral nonobstructing nephrolithiasis on CT scan in the Emergency Department September 24.  There was a 4 mm stone in the right side of the bladder which was considered to be a recently passed stone.  The largest stone in the right midpole of the kidney measures 5 mm.  No hydronephrosis.  No previous history of kidney stones.  Prior to that had been seen last week at [facility] and was started on Keflex.  Apparently symptoms started as some frequency but then one evening, she developed right CVA pain radiating into right groin.  She had nausea.  No prior history of kidney stones.  She saw Dr. Gloriann Loan, Urologist on September 26.  She was referred there by Dr. Ronita Hipps, her GYN physician.  Dr. Gloriann Loan felt she had passed the stone.  Today, held discussion with her regarding management of urinary stones.  She really does not consume much coffee or tea.  Does drink sparkling water.  Does not consume carbonated beverages very often.  She has no history of hyperuricemia.  Has city water.  Currently asymptomatic.  Says Dr. Gloriann Loan called her over the weekend and started her on Augmentin indicating that she had a urinary tract infection.  Culture apparently grew greater than 100,000 CLL/mL of E. coli.  Organism was resistant to Keflex but sensitive to Augmentin.  Urine culture on September 24 at the emergency department showed no growth.  White blood cell count that day was 14,400.  Patient is feeling better.  She still has concerns that she may develop another episode of renal colic.  We talked about this and how to manage it.  I have given her prescription for Flomax 0.4 mg to take should she have another similar episode.  She is to follow-up with urology in 6 months.  She has a urine strainer.    Review of Systems see above     Objective:   Physical Exam  No CVA tenderness.   Urine dipstick is completely normal.      Assessment & Plan:  Right-sided renal colic with apparent passage of stone  Urinary tract infection-E. coli  Plan: Follow-up with urologist in 6 months.Stay well hydrated in warm weather.

## 2018-02-07 DIAGNOSIS — Z6821 Body mass index (BMI) 21.0-21.9, adult: Secondary | ICD-10-CM | POA: Diagnosis not present

## 2018-02-07 DIAGNOSIS — Z01419 Encounter for gynecological examination (general) (routine) without abnormal findings: Secondary | ICD-10-CM | POA: Diagnosis not present

## 2018-02-20 ENCOUNTER — Ambulatory Visit (INDEPENDENT_AMBULATORY_CARE_PROVIDER_SITE_OTHER): Payer: 59 | Admitting: Internal Medicine

## 2018-02-20 DIAGNOSIS — Z23 Encounter for immunization: Secondary | ICD-10-CM | POA: Diagnosis not present

## 2018-02-20 DIAGNOSIS — N200 Calculus of kidney: Secondary | ICD-10-CM | POA: Diagnosis not present

## 2018-02-20 NOTE — Patient Instructions (Signed)
Flu vaccine given by CMA 

## 2018-02-20 NOTE — Progress Notes (Signed)
Flu vaccine given by CMA 

## 2018-06-11 DIAGNOSIS — L738 Other specified follicular disorders: Secondary | ICD-10-CM | POA: Diagnosis not present

## 2018-07-27 DIAGNOSIS — N23 Unspecified renal colic: Secondary | ICD-10-CM | POA: Diagnosis not present

## 2018-07-27 DIAGNOSIS — N200 Calculus of kidney: Secondary | ICD-10-CM | POA: Diagnosis not present

## 2019-03-06 ENCOUNTER — Ambulatory Visit: Payer: 59 | Admitting: Internal Medicine

## 2019-03-13 ENCOUNTER — Other Ambulatory Visit: Payer: Self-pay

## 2019-03-13 ENCOUNTER — Encounter: Payer: Self-pay | Admitting: Internal Medicine

## 2019-03-13 ENCOUNTER — Ambulatory Visit (INDEPENDENT_AMBULATORY_CARE_PROVIDER_SITE_OTHER): Payer: 59 | Admitting: Internal Medicine

## 2019-03-13 DIAGNOSIS — Z23 Encounter for immunization: Secondary | ICD-10-CM | POA: Diagnosis not present

## 2019-03-13 NOTE — Progress Notes (Signed)
Flu vaccine given per CMA 

## 2019-03-13 NOTE — Patient Instructions (Signed)
Patient received a flu vaccine IM L deltoid, AV, CMA  

## 2019-04-02 ENCOUNTER — Telehealth: Payer: Self-pay | Admitting: Internal Medicine

## 2019-04-02 NOTE — Telephone Encounter (Signed)
After speaking with Dr Renold Genta she suggested going ahead and doing CPE labs now and adding [facility] [place] to them.  LVM for [name] to call back and schedule lab appointment.

## 2019-04-02 NOTE — Telephone Encounter (Signed)
Mia Nguyen [phone]  [name] called to say that she is expicering fatigue, hair loss, heavy krazy periods.

## 2019-04-02 NOTE — Telephone Encounter (Signed)
Patient called back and scheduled labs

## 2019-04-11 ENCOUNTER — Other Ambulatory Visit: Payer: Self-pay

## 2019-04-11 ENCOUNTER — Other Ambulatory Visit: Payer: 59 | Admitting: Internal Medicine

## 2019-04-11 DIAGNOSIS — R5383 Other fatigue: Secondary | ICD-10-CM

## 2019-04-11 DIAGNOSIS — L659 Nonscarring hair loss, unspecified: Secondary | ICD-10-CM

## 2019-04-11 DIAGNOSIS — Z1321 Encounter for screening for nutritional disorder: Secondary | ICD-10-CM

## 2019-04-11 DIAGNOSIS — Z8742 Personal history of other diseases of the female genital tract: Secondary | ICD-10-CM

## 2019-04-11 DIAGNOSIS — Z1329 Encounter for screening for other suspected endocrine disorder: Secondary | ICD-10-CM

## 2019-04-11 DIAGNOSIS — Z13220 Encounter for screening for lipoid disorders: Secondary | ICD-10-CM

## 2019-04-11 DIAGNOSIS — N920 Excessive and frequent menstruation with regular cycle: Secondary | ICD-10-CM

## 2019-04-11 DIAGNOSIS — Z0000 Encounter for general adult medical examination without abnormal findings: Secondary | ICD-10-CM

## 2019-04-12 LAB — COMPLETE METABOLIC PANEL WITH GFR
AG Ratio: 1.8 (calc) (ref 1.0–2.5)
ALT: 14 U/L (ref 6–29)
AST: 19 U/L (ref 10–30)
Albumin: 4.8 g/dL (ref 3.6–5.1)
Alkaline phosphatase (APISO): 43 U/L (ref 31–125)
BUN: 12 mg/dL (ref 7–25)
CO2: 24 mmol/L (ref 20–32)
Calcium: 9.7 mg/dL (ref 8.6–10.2)
Chloride: 103 mmol/L (ref 98–110)
Creat: 0.69 mg/dL (ref 0.50–1.10)
GFR, Est African American: 128 mL/min/{1.73_m2} (ref >=60)
GFR, Est Non African American: 110 mL/min/{1.73_m2} (ref >=60)
Globulin: 2.6 g/dL (calc) (ref 1.9–3.7)
Glucose, Bld: 79 mg/dL (ref 65–99)
Potassium: 4.5 mmol/L (ref 3.5–5.3)
Sodium: 137 mmol/L (ref 135–146)
Total Bilirubin: 0.6 mg/dL (ref 0.2–1.2)
Total Protein: 7.4 g/dL (ref 6.1–8.1)

## 2019-04-12 LAB — LIPID PANEL
Cholesterol: 185 mg/dL (ref <200)
HDL: 82 mg/dL (ref >=50)
LDL Cholesterol (Calc): 92 mg/dL (calc)
Non-HDL Cholesterol (Calc): 103 mg/dL (calc) (ref <130)
Total CHOL/HDL Ratio: 2.3 (calc) (ref <5.0)
Triglycerides: 36 mg/dL (ref <150)

## 2019-04-12 LAB — CBC WITH DIFFERENTIAL/PLATELET
Absolute Monocytes: 501 cells/uL (ref 200–950)
Basophils Absolute: 28 cells/uL (ref 0–200)
Basophils Relative: 0.5 %
Eosinophils Absolute: 28 cells/uL (ref 15–500)
Eosinophils Relative: 0.5 %
HCT: 44.1 % (ref 35.0–45.0)
Hemoglobin: 14.5 g/dL (ref 11.7–15.5)
Lymphs Abs: 1766 cells/uL (ref 850–3900)
MCH: 30.7 pg (ref 27.0–33.0)
MCHC: 32.9 g/dL (ref 32.0–36.0)
MCV: 93.2 fL (ref 80.0–100.0)
MPV: 9.8 fL (ref 7.5–12.5)
Monocytes Relative: 9.1 %
Neutro Abs: 3179 cells/uL (ref 1500–7800)
Neutrophils Relative %: 57.8 %
Platelets: 296 10*3/uL (ref 140–400)
RBC: 4.73 10*6/uL (ref 3.80–5.10)
RDW: 11.8 % (ref 11.0–15.0)
Total Lymphocyte: 32.1 %
WBC: 5.5 10*3/uL (ref 3.8–10.8)

## 2019-04-12 LAB — IRON,TIBC AND FERRITIN PANEL
%SAT: 27 % (calc) (ref 16–45)
Ferritin: 20 ng/mL (ref 16–154)
Iron: 107 ug/dL (ref 40–190)
TIBC: 390 mcg/dL (calc) (ref 250–450)

## 2019-04-12 LAB — FOLLICLE STIMULATING HORMONE: FSH: 4 m[IU]/mL

## 2019-04-12 LAB — TSH: TSH: 0.88 mIU/L

## 2019-04-12 LAB — VITAMIN D 25 HYDROXY (VIT D DEFICIENCY, FRACTURES): Vit D, 25-Hydroxy: 21 ng/mL — ABNORMAL LOW (ref 30–100)

## 2019-04-23 ENCOUNTER — Other Ambulatory Visit: Payer: 59

## 2019-05-06 IMAGING — CT CT RENAL STONE PROTOCOL
2 of 4 series · 16 of 46 positions shown, 18 images · non-contrast
Comparison: None.

CLINICAL DATA: Right flank pain

EXAM:
CT ABDOMEN AND PELVIS WITHOUT CONTRAST
TECHNIQUE: Multidetector CT imaging of the abdomen and pelvis was performed
following the standard protocol without IV contrast.

[Series 2: axial st · axial · 0.61mm/px · z∈[-625,-250]mm · 13 of 85 slices shown, 15 images]
[im 5/85  soft-tissue]
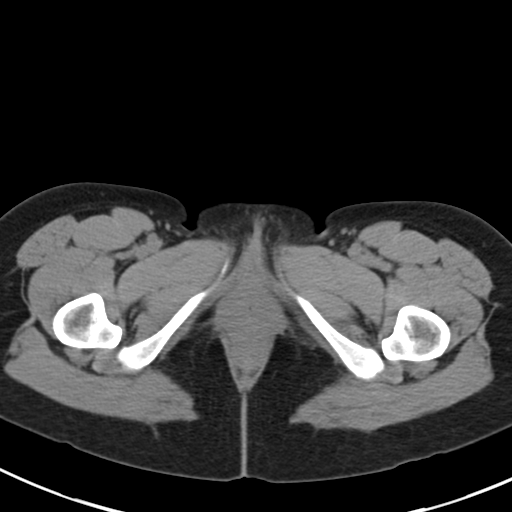
[im 5/85  bone]
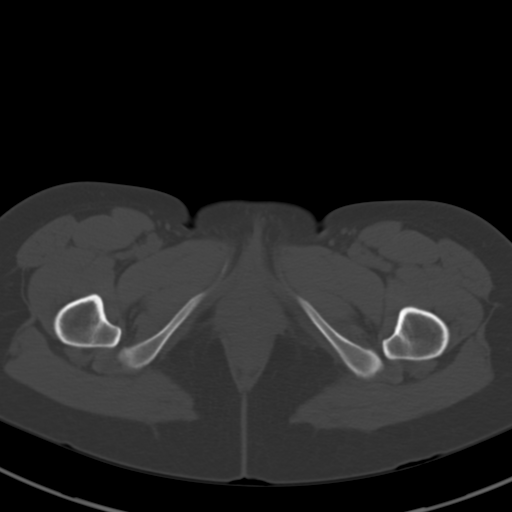
[im 14/85  soft-tissue]
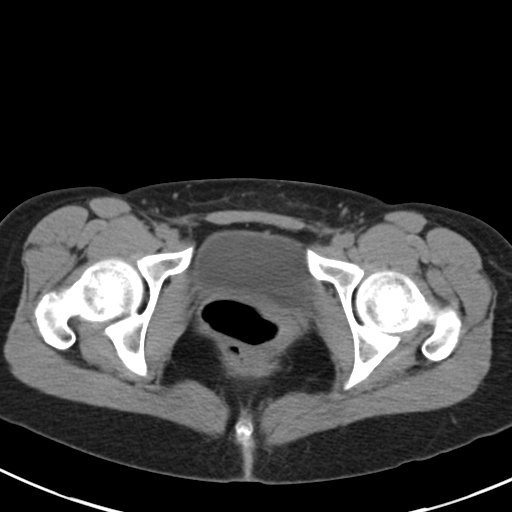
[im 18/85  soft-tissue]
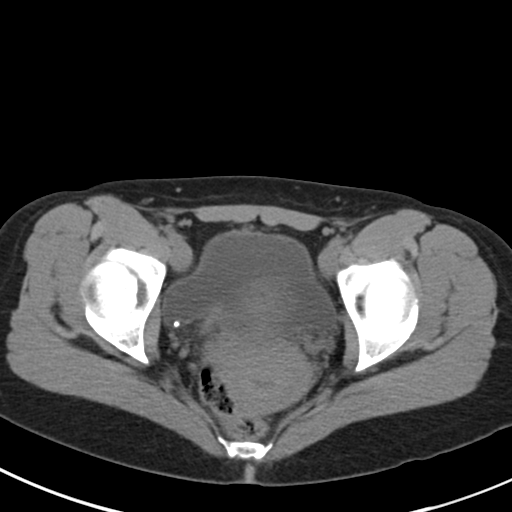
[im 23/85  soft-tissue]
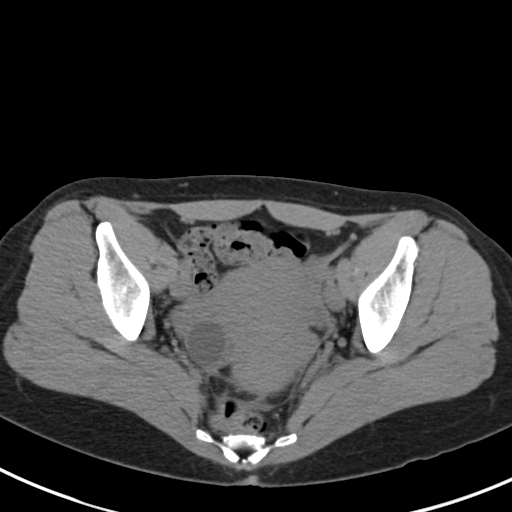
[im 31/85  soft-tissue]
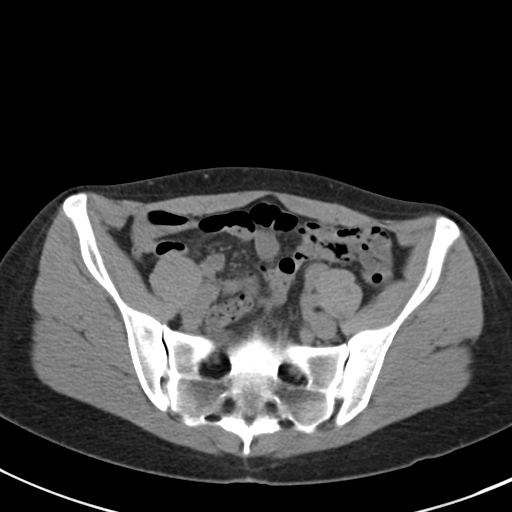
[im 36/85  soft-tissue]
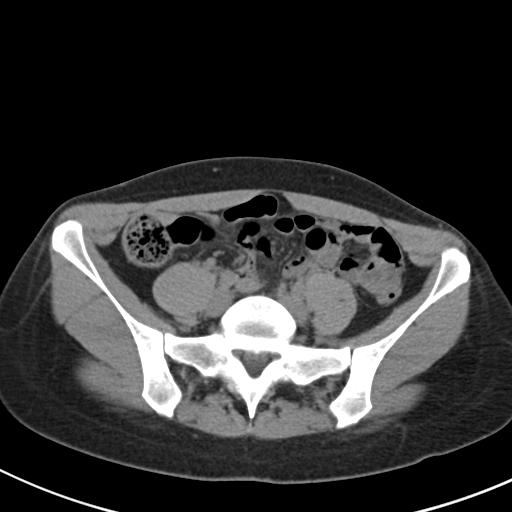
[im 45/85  soft-tissue]
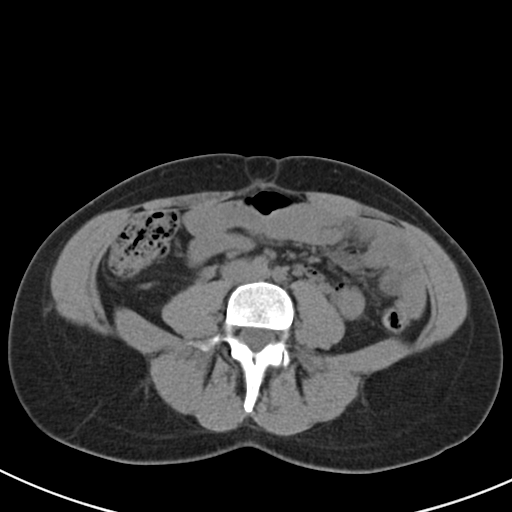
[im 49/85  soft-tissue]
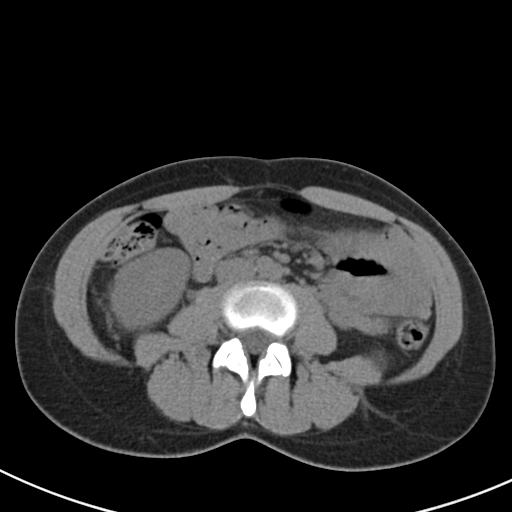
[im 54/85  soft-tissue]
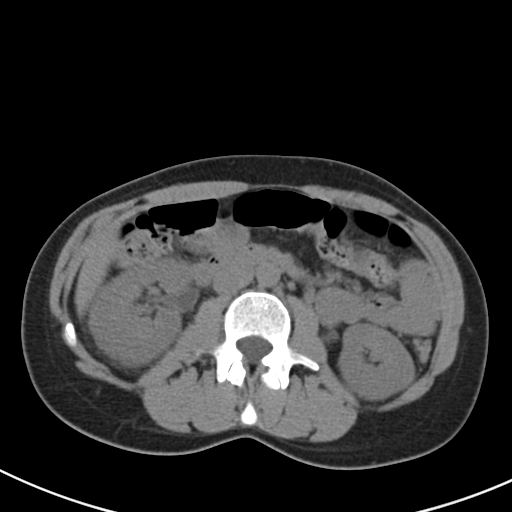
[im 54/85  bone]
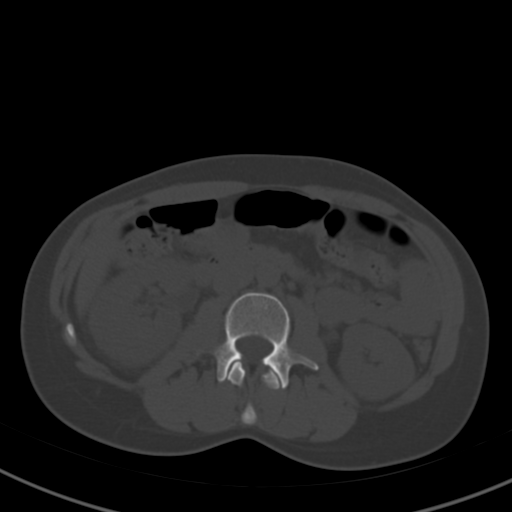
[im 62/85  soft-tissue]
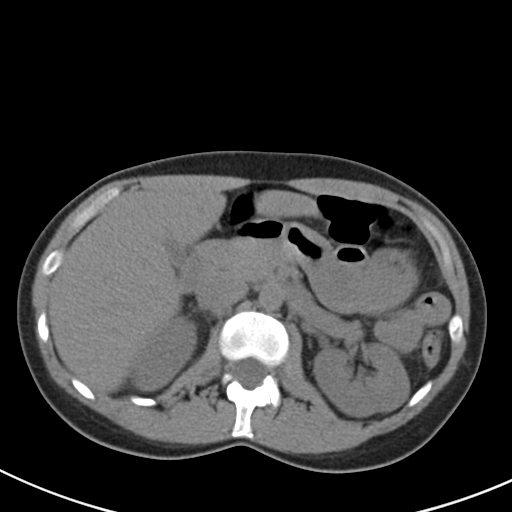
[im 67/85  soft-tissue]
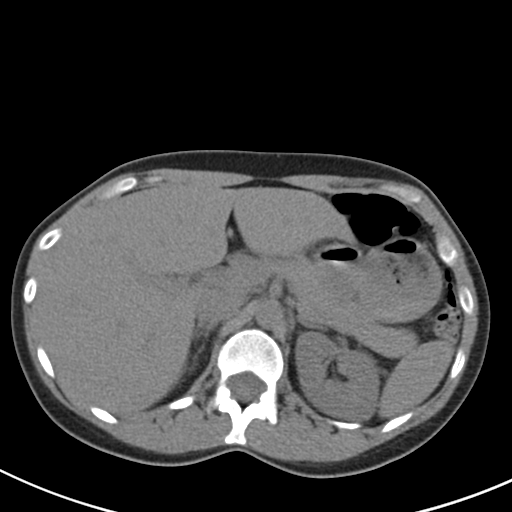
[im 71/85  soft-tissue]
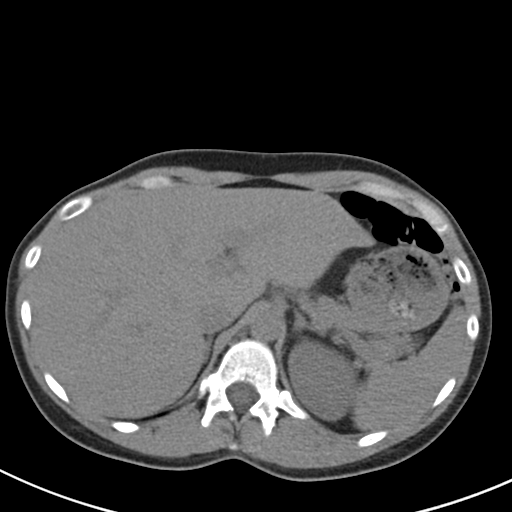
[im 80/85  soft-tissue]
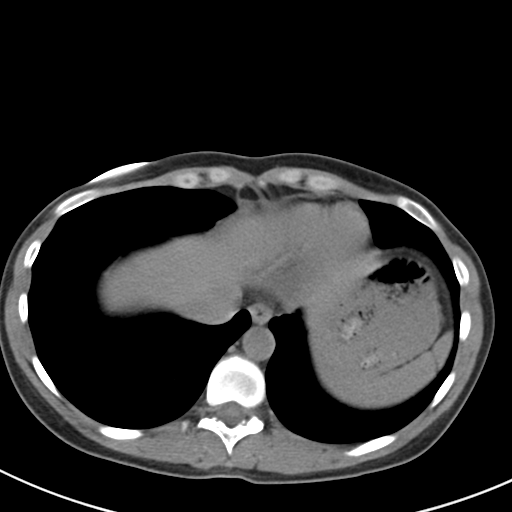

[Series 4: coronal · coronal · 0.60mm/px · 3 of 88 slices shown]
[im 30/88  soft-tissue]
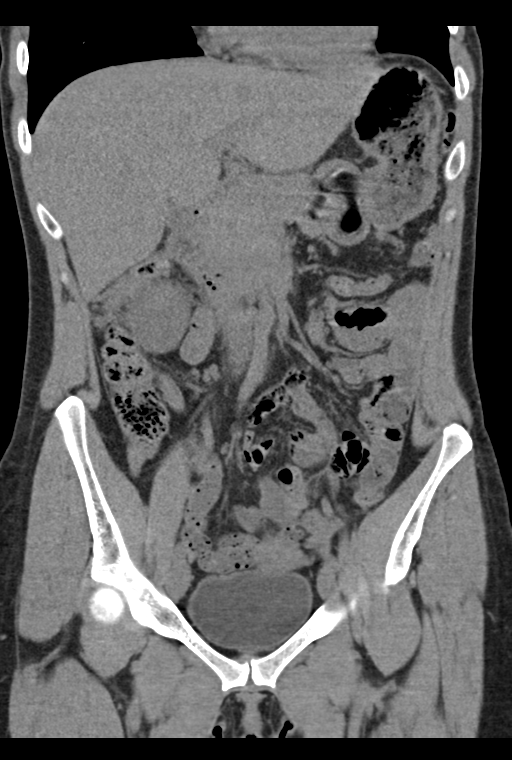
[im 39/88  soft-tissue]
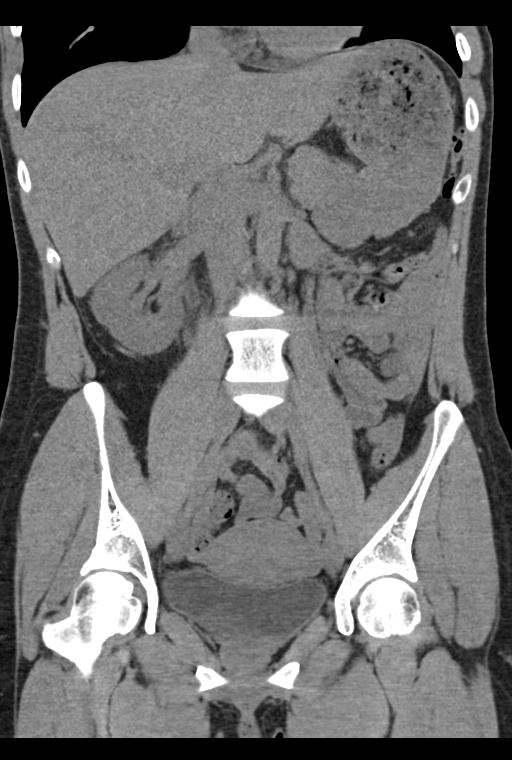
[im 49/88  soft-tissue]
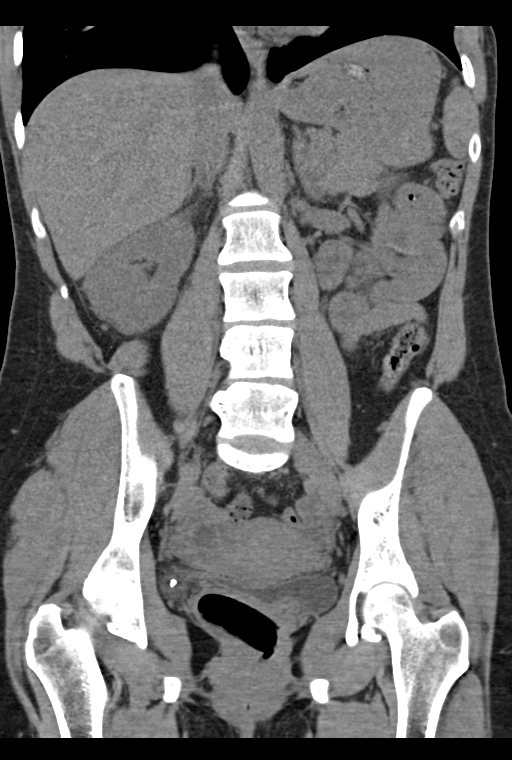

[16 of 46 positions shown; findings below may reference images not displayed]

FINDINGS: Lower chest: No acute abnormality

Hepatobiliary: No focal hepatic abnormality. Gallbladder
unremarkable.

Pancreas: No focal abnormality or ductal dilatation.

Spleen: No focal abnormality.  Normal size.

Adrenals/Urinary Tract: Bilateral nonobstructing renal stones, the
largest in the right midpole measuring 5 mm. No hydronephrosis.
Slight perinephric stranding around the right kidney. There is a
coarse cm stone layering dependently in the right side of the
bladder, likely recently passed stone. Adrenal glands unremarkable.

Stomach/Bowel: Normal appendix. Stomach, large and small bowel
grossly unremarkable.

Vascular/Lymphatic: No evidence of aneurysm or adenopathy.

Reproductive: 2.5 cm functional cyst or follicle in the right ovary.
Uterus and left adnexa unremarkable.

Other: No free fluid or free air.

Musculoskeletal: No acute bony abnormality.
IMPRESSION: Bilateral nonobstructing nephrolithiasis. Slight perinephric
stranding around the right kidney without overt hydronephrosis.
There is a 4 mm stone layering dependently in the right side of the
urinary bladder, likely recently passed stone.

## 2019-05-24 ENCOUNTER — Other Ambulatory Visit: Payer: 59

## 2019-06-10 ENCOUNTER — Other Ambulatory Visit: Payer: 59 | Admitting: Internal Medicine

## 2019-06-13 ENCOUNTER — Encounter: Payer: 59 | Admitting: Internal Medicine

## 2019-06-24 ENCOUNTER — Telehealth: Payer: Self-pay | Admitting: Internal Medicine

## 2019-06-24 ENCOUNTER — Other Ambulatory Visit: Payer: Self-pay

## 2019-06-24 ENCOUNTER — Ambulatory Visit (INDEPENDENT_AMBULATORY_CARE_PROVIDER_SITE_OTHER): Payer: 59 | Admitting: Internal Medicine

## 2019-06-24 ENCOUNTER — Encounter: Payer: Self-pay | Admitting: Internal Medicine

## 2019-06-24 DIAGNOSIS — E559 Vitamin D deficiency, unspecified: Secondary | ICD-10-CM

## 2019-06-24 DIAGNOSIS — J0100 Acute maxillary sinusitis, unspecified: Secondary | ICD-10-CM | POA: Diagnosis not present

## 2019-06-24 MED ORDER — AZITHROMYCIN 250 MG PO TABS: ORAL_TABLET | ORAL | 0 refills | Status: DC

## 2019-06-24 MED ORDER — ERGOCALCIFEROL 1.25 MG (50000 UT) PO CAPS: 50000.0000 [IU] | ORAL_CAPSULE | ORAL | 0 refills | Status: DC

## 2019-06-24 NOTE — Progress Notes (Addendum)
   Subjective:    Patient ID: Marilynn Latino, female    DOB: May 16, 1980, 39 y.o.   MRN: [id number]  HPI 39 year old Female recently traveled to [place] for skiing vacation.  Tested negative for Covid on January 22 prior to vacation.  After arriving there and having to wear a mask constantly including on the ski slopes, she developed a sore throat and nasal congestion.  Had no headache.  No dysgeusia.  Subsequently developed some chest congestion with discolored sputum.  Discolored nasal drainage as well.  No fever.  Tested negative for COVID-19 at urgent care on February 18.  General health is excellent and she has upcoming health maintenance exam in April.  She recently had pretty physical examination labs in December including CBC C met lipid panel TSH and vitamin D.  Vitamin D level was low at 21.  She will be placed on high-dose vitamin D supplement weekly for 12 weeks.  We will discuss further treatment of vitamin D deficiency in April.  Patient is seen today by interactive audio and video telecommunications due to the Coronavirus pandemic.  She is agreeable to visit in this format today.  She is identified using 2 identifiers as Laverle Patter, a patient in this practice.  Review of Systems see above no nausea, vomiting, diarrhea, dysgeusia     Objective:   Physical Exam  Seen virtually today in no acute distress.  Is clear concise history consistent with respiratory infection      Assessment & Plan:  Acute maxillary sinusitis  Plan: Zithromax Z-PAK 2 p.o. day 1 followed by 1 p.o. days 2 through 5  Vitamin D deficiency  Plan: Drisdol 50,000 units weekly for 12 weeks.  Keep appointment for physical exam in April.

## 2019-06-24 NOTE — Patient Instructions (Signed)
Zithromax Z-PAK 2 p.o. day 1 followed by 1 p.o. days 2 through 5.  Drisdol 50,000 units weekly for 12 weeks.

## 2019-06-24 NOTE — Telephone Encounter (Signed)
Virtual visit 

## 2019-06-24 NOTE — Telephone Encounter (Signed)
Mia Nguyen  [place] called to say for the last week she has what she thinks is normal sinus, she on Thursday to be tested for COVID, it came back negative on Saturday. She has chest congestion, stuffy head and nose, cough, throat was sore a week ago but now is just dry, No headache, no fever

## 2019-07-30 ENCOUNTER — Encounter: Payer: 59 | Admitting: Internal Medicine

## 2019-08-26 ENCOUNTER — Encounter: Payer: Self-pay | Admitting: Internal Medicine

## 2019-08-26 ENCOUNTER — Ambulatory Visit: Payer: 59 | Admitting: Internal Medicine

## 2019-08-26 ENCOUNTER — Other Ambulatory Visit: Payer: Self-pay

## 2019-08-26 VITALS — BP 150/90 | HR 80 | Temp 98.0°F | Ht 63.0 in | Wt 118.0 lb

## 2019-08-26 DIAGNOSIS — Z0000 Encounter for general adult medical examination without abnormal findings: Secondary | ICD-10-CM | POA: Diagnosis not present

## 2019-08-26 DIAGNOSIS — R8290 Unspecified abnormal findings in urine: Secondary | ICD-10-CM

## 2019-08-26 DIAGNOSIS — E559 Vitamin D deficiency, unspecified: Secondary | ICD-10-CM

## 2019-08-26 LAB — POCT URINALYSIS DIPSTICK
Bilirubin, UA: NEGATIVE
Glucose, UA: NEGATIVE
Ketones, UA: NEGATIVE
Nitrite, UA: NEGATIVE
Protein, UA: NEGATIVE
Spec Grav, UA: 1.01 (ref 1.010–1.025)
Urobilinogen, UA: 0.2 E.U./dL
pH, UA: 6.5 (ref 5.0–8.0)

## 2019-08-26 MED ORDER — ERGOCALCIFEROL 1.25 MG (50000 UT) PO CAPS: 50000.0000 [IU] | ORAL_CAPSULE | ORAL | 3 refills | Status: DC

## 2019-08-26 NOTE — Progress Notes (Signed)
   Subjective:    Patient ID: Mia Nguyen, female    DOB: 1980-11-02, 39 y.o.   MRN: [id number]  HPI Pleasant 38 year old Female in for health maintenance exam.  Says Dr. Ronita Hipps, her gynecologist, was concerned about her blood pressure being elevated on recent exam.  Blood pressure is elevated today here in this office.  She will obtain a home blood pressure monitor and keep track of her blood pressures and let me know how things are going in a few weeks.  If she continues with persistently elevated blood pressure readings, she will need to stop Vyvanse.  She has history of attention deficit issues and has taken Vyvanse  History of coccydynia in 2016 treated by Dr. Hulan Saas.  She has had 2 COVID-19 injections the last one being July 23, 2019.  Tetanus immunization is up-to-date.  History of bilateral nonobstructing nephrolithiasis on CT scan in ED department September 2019.  Had developed right CVA pain radiating into right groin.  She saw urologist who felt that she had passed the stone.  No further recurrence of kidney stones.  Occasionally gets ear infections.  She was seen virtually in February after developing sore throat and nasal congestion on trip to [place].  She was treated with Zithromax.   Social history: Married.  3 children, 2 sons and a daughter.  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  This is a new venture for her.  She seems excited about it.  She does not smoke.  Social alcohol consumption.  Husband is in [place].  She is allergic to Sulfa-causes a rash.  Family history: History of hypertension in her mother.  History of kidney stones in father.  Labs are within normal limits with exception of vitamin D level of 21 checked in December 2020.  At that time she was started on high-dose vitamin D weekly.   Review of Systems  Respiratory: Negative.   Cardiovascular: Negative.    Gastrointestinal: Negative.   Genitourinary: Negative.   Musculoskeletal: Negative.   Neurological: Negative.   Psychiatric/Behavioral: Negative.        Objective:   Physical Exam BP 150/90 taken by CMA, rechecked 140/90 left arm taken by Dr. Renold Genta. Height 5 feet 3 inches, BMI 20.90, weight 118 pounds.  Skin warm and dry.  No cervical adenopathy.  No thyromegaly.  Chest clear to auscultation.  TMs are clear.  Breast exam deferred to Dr. Ronita Hipps, Cardiac exam: regular rate and rhythm, normal S1 and S2 without murmurs or gallops.  Abdomen: Soft nondistended without hepatosplenomegaly masses or tenderness.  Pelvic exam deferred to Dr. Ronita Hipps.  No lower extremity edema or deformity.  Neuro intact without focal deficits.  Affect thought and judgment are normal.       Assessment & Plan:  Vitamin D deficiency-have prescribed Drisdol 50,000 units weekly as level was 21 when taken at the end of 2020.  Elevated blood pressure reading-patient will purchase home blood pressure cuff and keep a record of blood pressure readings and follow-up in late May.  Abnormal urinalysis today- she has leukocyte esterase on dipstick UA but no complaint of urinary symptoms.   A culture has been ordered.  History of attention deficit disorder treated with Vyvanse

## 2019-08-26 NOTE — Patient Instructions (Addendum)
It was a pleasure to see you today.  A record of your home blood pressure readings and follow-up in late May.  Take 50,000 units vitamin D3 weekly for vitamin D deficiency.  You have a few white cells in your urine and a culture has been ordered.

## 2019-08-28 ENCOUNTER — Other Ambulatory Visit: Payer: Self-pay

## 2019-08-28 LAB — URINE CULTURE
MICRO NUMBER:: 10405241
SPECIMEN QUALITY:: ADEQUATE

## 2019-08-28 MED ORDER — NITROFURANTOIN MONOHYD MACRO 100 MG PO CAPS: 100.0000 mg | ORAL_CAPSULE | Freq: Two times a day (BID) | ORAL | 0 refills | Status: DC

## 2019-09-23 ENCOUNTER — Other Ambulatory Visit: Payer: Self-pay

## 2019-09-23 ENCOUNTER — Encounter: Payer: Self-pay | Admitting: Internal Medicine

## 2019-09-23 ENCOUNTER — Ambulatory Visit (INDEPENDENT_AMBULATORY_CARE_PROVIDER_SITE_OTHER): Payer: 59 | Admitting: Internal Medicine

## 2019-09-23 VITALS — BP 120/80 | HR 88 | Ht 63.0 in | Wt 114.0 lb

## 2019-09-23 DIAGNOSIS — B9629 Other Escherichia coli [E. coli] as the cause of diseases classified elsewhere: Secondary | ICD-10-CM

## 2019-09-23 DIAGNOSIS — Z87442 Personal history of urinary calculi: Secondary | ICD-10-CM

## 2019-09-23 DIAGNOSIS — N390 Urinary tract infection, site not specified: Secondary | ICD-10-CM

## 2019-09-23 DIAGNOSIS — R030 Elevated blood-pressure reading, without diagnosis of hypertension: Secondary | ICD-10-CM | POA: Diagnosis not present

## 2019-09-23 DIAGNOSIS — Z1612 Extended spectrum beta lactamase (ESBL) resistance: Secondary | ICD-10-CM

## 2019-09-23 DIAGNOSIS — R8290 Unspecified abnormal findings in urine: Secondary | ICD-10-CM

## 2019-09-23 DIAGNOSIS — Z8639 Personal history of other endocrine, nutritional and metabolic disease: Secondary | ICD-10-CM

## 2019-09-23 LAB — POCT URINALYSIS DIPSTICK
Appearance: NEGATIVE
Bilirubin, UA: NEGATIVE
Glucose, UA: NEGATIVE
Ketones, UA: NEGATIVE
Leukocytes, UA: NEGATIVE
Nitrite, UA: NEGATIVE
Odor: NEGATIVE
Protein, UA: NEGATIVE
Spec Grav, UA: 1.015 (ref 1.010–1.025)
Urobilinogen, UA: 0.2 E.U./dL
pH, UA: 6.5 (ref 5.0–8.0)

## 2019-09-23 NOTE — Progress Notes (Signed)
   Subjective:    Patient ID: Mia Nguyen, female    DOB: Aug 07, 1980, 39 y.o.   MRN: [id number]  HPI Pleasant 39 year old Female in to follow up on ESBL UTI and BP readings.  It seems that her GYN physician was concerned about her blood pressure on recent GYN exam.  At last visit blood pressure was 150/90 here in the office.  She brings in multiple blood pressure readings Which are noted below: 133/91, 123/81,144/79.  She has not taken it very much over the past few weeks.  We will continue to monitor.  Blood pressure is excellent today at 120/80.  Strong family history of essential hypertension in both parents.  Patient has history of kidney stones.  On last visit she had urine dipstick showing moderate occult blood and small LE.  Urine culture growing ESBL E. coli.  She was basically asymptomatic.  Organism was sensitive to Macrobid and she was treated for 10-day course with this.  Today urine dipstick once again shows moderate occult blood and was sent for microscopic examination.  No LE is present today.  Culture was also sent once again.  Says she feels a little discomfort at the end of urine stream.  She does have Urologist because of history of kidney stones.  History of vitamin D deficiency and is maintained on high-dose vitamin D once weekly.  Review of Systems see above    Objective:   Physical Exam Spoke with patient today for about 20 minutes regarding blood pressure which we will continue to follow and she will continue to monitor at home.  Also discussed ESBL E. coli urinary tract infection and its multidrug resistance.  Explained to her that we are going to reculture her urine today and will let her know the results.  She is agreeable to this.       Assessment & Plan:  History of elevated blood pressure reading-continue to monitor blood pressure at home and let me know if persistently elevated.  Blood pressure is excellent today here.  Family history of  hypertension  Vitamin D deficiency treated with high-dose vitamin D weekly  ESBL E. coli UTI-culture has been repeated today as well as microscopic urine specimen with further instructions to follow.

## 2019-09-23 NOTE — Patient Instructions (Signed)
Urine has been recultured status post treatment with Macrobid to ensure urine has been eradicated on ESBL E. coli bacteria.  Continue to monitor blood pressure at home and let me know if persistently elevated.  Continue high-dose vitamin D supplement weekly.

## 2019-09-26 LAB — URINALYSIS, MICROSCOPIC ONLY
Bacteria, UA: NONE SEEN /HPF
Hyaline Cast: NONE SEEN /LPF
RBC / HPF: NONE SEEN /HPF (ref 0–2)
Squamous Epithelial / LPF: NONE SEEN /HPF (ref <=5)
WBC, UA: NONE SEEN /HPF (ref 0–5)

## 2019-09-26 LAB — URINE CULTURE
MICRO NUMBER:: 10511764
SPECIMEN QUALITY:: ADEQUATE

## 2019-09-28 ENCOUNTER — Telehealth (INDEPENDENT_AMBULATORY_CARE_PROVIDER_SITE_OTHER): Payer: 59 | Admitting: Internal Medicine

## 2019-09-28 ENCOUNTER — Encounter: Payer: Self-pay | Admitting: Internal Medicine

## 2019-09-28 MED ORDER — FOSFOMYCIN TROMETHAMINE 3 G PO PACK: 3.0000 g | PACK | Freq: Once | ORAL | 5 refills | Status: AC

## 2019-09-28 NOTE — Telephone Encounter (Signed)
Have not heard back from patient. She is likely out of town. Believe fosfomycin is best option for ESBL E. Coli UTI.  Dose is: 3 grams q 3 days for 14 days. Had left her detailed voice mail regarding 2 antibiotic choices. Believe this in best option and she will likely need follow up with her Urologist. Research treatment, time leaving patient detailed message and contacting pharmacy 15 minutes.

## 2019-10-01 ENCOUNTER — Telehealth: Payer: Self-pay

## 2019-10-01 NOTE — Telephone Encounter (Signed)
I returned call. Suspect it has something to do with insurance or stock availability. Have asked her to contact pharmacy for clarification and I have left detailed dose directions on the voice mail.

## 2019-10-01 NOTE — Telephone Encounter (Signed)
Referral was placed, patient aware.

## 2019-10-01 NOTE — Telephone Encounter (Signed)
Patient states that she went to pharmacy and picked up RX and they only gave her 3 boxes.  She is very confused about the dosage and direction on this medication.  She would like a call back at [phone]

## 2019-10-01 NOTE — Telephone Encounter (Signed)
Patient called she said she got your voicemail and she would like to be referred to a kidney doctor instead of urologist she thinks it's a kidney issue. She said symptoms are about the same. She said she just picked up the fosfomycin today. She said a kidney doctor of your choice.

## 2019-10-01 NOTE — Telephone Encounter (Signed)
Please fill out paperwork for [facility]

## 2019-10-05 DIAGNOSIS — N390 Urinary tract infection, site not specified: Secondary | ICD-10-CM

## 2019-10-07 MED ORDER — FOSFOMYCIN TROMETHAMINE 3 G PO PACK: 3.0000 g | PACK | Freq: Once | ORAL | 0 refills | Status: AC

## 2019-10-07 NOTE — Telephone Encounter (Signed)
Fosfomycin 3 grams one time dose e-scribed to [facility] on [address]  as requested by patient. MJB

## 2019-10-08 ENCOUNTER — Telehealth: Payer: Self-pay | Admitting: Internal Medicine

## 2019-10-08 NOTE — Telephone Encounter (Signed)
Have left detailed voice mail for pt to go to ED in [facility]

## 2019-10-08 NOTE — Telephone Encounter (Signed)
Mia Nguyen called to say she is out of town and about an hour ago she started having sharp crazy right side pain and it wraps around, she called urologist and she has not seen them in over a year so they said go to ER where she is at or urgent care. She said something about 3rd round of antibiotic.

## 2019-10-24 ENCOUNTER — Ambulatory Visit: Payer: 59 | Admitting: Internal Medicine

## 2019-10-24 ENCOUNTER — Other Ambulatory Visit: Payer: Self-pay

## 2019-10-24 ENCOUNTER — Encounter: Payer: Self-pay | Admitting: Internal Medicine

## 2019-10-24 VITALS — BP 130/90 | HR 85 | Ht 63.0 in | Wt 114.0 lb

## 2019-10-24 DIAGNOSIS — R5383 Other fatigue: Secondary | ICD-10-CM | POA: Diagnosis not present

## 2019-10-24 DIAGNOSIS — N390 Urinary tract infection, site not specified: Secondary | ICD-10-CM | POA: Diagnosis not present

## 2019-10-24 DIAGNOSIS — R319 Hematuria, unspecified: Secondary | ICD-10-CM

## 2019-10-24 DIAGNOSIS — Z87442 Personal history of urinary calculi: Secondary | ICD-10-CM | POA: Diagnosis not present

## 2019-10-24 DIAGNOSIS — Z1612 Extended spectrum beta lactamase (ESBL) resistance: Secondary | ICD-10-CM | POA: Diagnosis not present

## 2019-10-24 DIAGNOSIS — B9629 Other Escherichia coli [E. coli] as the cause of diseases classified elsewhere: Secondary | ICD-10-CM | POA: Diagnosis not present

## 2019-10-24 LAB — POCT URINALYSIS DIPSTICK
Appearance: NEGATIVE
Bilirubin, UA: NEGATIVE
Glucose, UA: NEGATIVE
Ketones, UA: NEGATIVE
Leukocytes, UA: NEGATIVE
Nitrite, UA: NEGATIVE
Odor: NEGATIVE
Protein, UA: NEGATIVE
Spec Grav, UA: 1.01 (ref 1.010–1.025)
Urobilinogen, UA: 0.2 E.U./dL
pH, UA: 6.5 (ref 5.0–8.0)

## 2019-10-24 NOTE — Progress Notes (Signed)
   Subjective:    Patient ID: Mia Nguyen, female    DOB: 1981-04-16, 39 y.o.   MRN: [id number]  HPI 39 year old female in today for follow-up.  She was on vacation in [place] and developed sharp right lower quadrant abdominal pain wrapping around to her flank area.  She went to ER in [place] and was found to have a 6 mm kidney stone.  Prior to that I had put her on fosfomycin for ESBL UTI.  She is here to follow-up today on both of these issues.  Also complaining of fatigue. She has a picture of kidney stone that she collected recently.  It is dark in color.  If she brings it in and we can send it for analysis.  History of bilateral nonobstructing nephrolithiasis on CT scan in the emergency department September 2019.  Had developed right CVA pain radiating into right groin.  She saw urologist who felt she had passed the stone.  This recent stone was the first 1 since that time. We discussed follow-up with urologist.  She has learned that [facility] has a stone clinic through the urology department there and would like to be seen there.  We can make referral.  Review of Systems see above no UTI symptoms or dysuria.  No fever or chills or back pain.     Objective:   Physical Exam No CVA tenderness.  Vital signs reviewed.  She is afebrile.  She has trace occult blood on urine dipstick.  This will be sent for urinalysis and culture.       Assessment & Plan:  History of 2 right kidney stones the last 1 just recently while at the beach.  If she brings it in we can have it analyzed.  History of ESBL UTI treated with fosfomycin.  Urinalysis today shows trace occult blood.  Urine will be sent for microscopic and culture.  Fatigue-have drawn today CBC with differential, vitamin B12, ferritin, folate and TSH levels.

## 2019-10-24 NOTE — Patient Instructions (Signed)
It was a pleasure to see you today.  We will make referral to [facility] kidney stone clinic.  Please bring kidney stone that she passed recently and so we can send it for analysis.  Urine has been sent for culture and microscopic evaluation.  Labs have been drawn for fatigue.

## 2019-10-25 ENCOUNTER — Telehealth: Payer: Self-pay | Admitting: Internal Medicine

## 2019-10-25 LAB — URINALYSIS, MICROSCOPIC ONLY
Bacteria, UA: NONE SEEN /HPF
Hyaline Cast: NONE SEEN /LPF
Squamous Epithelial / LPF: NONE SEEN /HPF (ref <=5)
WBC, UA: NONE SEEN /HPF (ref 0–5)

## 2019-10-25 LAB — CBC WITH DIFFERENTIAL/PLATELET
Absolute Monocytes: 541 cells/uL (ref 200–950)
Basophils Absolute: 19 cells/uL (ref 0–200)
Basophils Relative: 0.4 %
Eosinophils Absolute: 28 cells/uL (ref 15–500)
Eosinophils Relative: 0.6 %
HCT: 40.4 % (ref 35.0–45.0)
Hemoglobin: 13.6 g/dL (ref 11.7–15.5)
Lymphs Abs: 1589 cells/uL (ref 850–3900)
MCH: 30.7 pg (ref 27.0–33.0)
MCHC: 33.7 g/dL (ref 32.0–36.0)
MCV: 91.2 fL (ref 80.0–100.0)
MPV: 10 fL (ref 7.5–12.5)
Monocytes Relative: 11.5 %
Neutro Abs: 2524 cells/uL (ref 1500–7800)
Neutrophils Relative %: 53.7 %
Platelets: 295 10*3/uL (ref 140–400)
RBC: 4.43 10*6/uL (ref 3.80–5.10)
RDW: 12.2 % (ref 11.0–15.0)
Total Lymphocyte: 33.8 %
WBC: 4.7 10*3/uL (ref 3.8–10.8)

## 2019-10-25 LAB — VITAMIN B12: Vitamin B-12: >2000 pg/mL — ABNORMAL HIGH (ref 200–1100)

## 2019-10-25 LAB — FERRITIN: Ferritin: 36 ng/mL (ref 16–154)

## 2019-10-25 LAB — URINE CULTURE
MICRO NUMBER:: 10630242
SPECIMEN QUALITY:: ADEQUATE

## 2019-10-25 LAB — FOLATE: Folate: >24 ng/mL

## 2019-10-25 LAB — TSH: TSH: 0.65 mIU/L

## 2019-10-25 NOTE — Telephone Encounter (Signed)
Faxed referral, Office notes, demographics to [facility] [phone], phone number [phone] pages) [place] office [facility] [address] [place], [place] [address]

## 2020-04-27 ENCOUNTER — Ambulatory Visit (INDEPENDENT_AMBULATORY_CARE_PROVIDER_SITE_OTHER): Payer: 59 | Admitting: Internal Medicine

## 2020-04-27 ENCOUNTER — Telehealth: Payer: Self-pay | Admitting: Internal Medicine

## 2020-04-27 ENCOUNTER — Other Ambulatory Visit: Payer: Self-pay

## 2020-04-27 VITALS — HR 88 | Temp 98.8°F

## 2020-04-27 DIAGNOSIS — R059 Cough, unspecified: Secondary | ICD-10-CM

## 2020-04-27 LAB — POCT INFLUENZA A/B
Influenza A, POC: NEGATIVE
Influenza B, POC: NEGATIVE

## 2020-04-27 MED ORDER — HYDROCODONE-HOMATROPINE 5-1.5 MG/5ML PO SYRP: 5.0000 mL | ORAL_SOLUTION | Freq: Three times a day (TID) | ORAL | 0 refills | Status: DC | PRN

## 2020-04-27 MED ORDER — AZITHROMYCIN 250 MG PO TABS: ORAL_TABLET | ORAL | 0 refills | Status: DC

## 2020-04-27 NOTE — Telephone Encounter (Signed)
Patient called back to confirm.

## 2020-04-27 NOTE — Telephone Encounter (Signed)
We will do car test and OV for flu and Covid

## 2020-04-27 NOTE — Progress Notes (Signed)
   Subjective:    Patient ID: Mia Nguyen, female    DOB: 09/18/1980, 39 y.o.   MRN: [id number]  HPI Pleasant 39 year old Female seen today for cough which developed last night.  Other individuals in her family has had a cough that progressed to fever, myalgias, congestion.  Last week they were around a child who tested positive for influenza.  Other family members around the child of also tested positive for influenza.  Patient's husband is also ill.  Patient wants to be treated tested for influenza.   She has history of kidney stones but general health is excellent.  History of vitamin D deficiency.  She and her family are planning to spend the [employer] holiday in [place] where they have a home.  She does not smoke.  Social alcohol consumption.  She is allergic to sulfa-causes a rash.  Review of Systems no nausea vomiting fever or dysgeusia or shaking chills.  She has had 2 maternal vaccines.  No recent influenza vaccine on file.     Objective:   Physical Exam TMs are clear.  Pharynx very slightly injected.  Chest is clear to auscultation without rales or wheezing.  Skin is warm and dry.  Rapid flu test is negative     Assessment & Plan:  Viral syndrome-rule out COVID-19.  Nasal swab is pending  Plan: Hycodan 1 teaspoon every 8 hours as needed for cough.  Zithromax Z-PAK 2 p.o. day 1 followed by 1 p.o. days 2 through 5.  She asked about Tamiflu for possible influenza but I do not think she has influenza.  Rapid flu test is negative.  Rest and drink plenty of fluids.

## 2020-04-27 NOTE — Telephone Encounter (Signed)
Left voicemail can see her at 12pm in her car.

## 2020-04-27 NOTE — Telephone Encounter (Signed)
Natara Monfort [phone]  Corrie Dandy called to say she started getting a cough last night, others in her family  Started out with a cough and then got fever, body aches, congestion, and fever. Last Tuesday they were around a child that tested positive on Wednesday for the flu, other family members tha was around child have also been sick and tested positive for FluMary's husband got sick after that and now her. She would like to be tested for Flu and then treated if possible.

## 2020-04-28 NOTE — Patient Instructions (Addendum)
Rapid flu test is negative.  COVID-19 test is pending.  Take Zithromax Z-PAK 2 p.o. day 1 followed by 1 p.o. days 2 through 5.  We will contact you with Covid test results.  May take Hycodan 1 teaspoon every 8 hours as needed for cough or sore throat pain.

## 2020-04-29 LAB — SARS-COV-2 RNA,(COVID-19) QUALITATIVE NAAT: SARS CoV2 RNA: NOT DETECTED

## 2020-08-24 ENCOUNTER — Telehealth: Payer: Self-pay | Admitting: Internal Medicine

## 2020-08-24 NOTE — Telephone Encounter (Addendum)
Mia Nguyen [place] called and would like to have alkaline phosphatase lab added to her CPE labs on 08/31/2020, her daughter recently had this lab and it was elevated. She went on to say she knows it has something to do with parathyroid that you guys have been watching and something about kidney stones.

## 2020-08-24 NOTE — Telephone Encounter (Signed)
Called and let patient know

## 2020-08-24 NOTE — Telephone Encounter (Signed)
This lab is automatically done with a C-met at time of CPE and fasting labs. I  checked and she had an alkaline phosphatase that was normal 2 years ago. 

## 2020-08-31 ENCOUNTER — Other Ambulatory Visit: Payer: 59 | Admitting: Internal Medicine

## 2020-08-31 ENCOUNTER — Other Ambulatory Visit: Payer: Self-pay

## 2020-08-31 DIAGNOSIS — Z0000 Encounter for general adult medical examination without abnormal findings: Secondary | ICD-10-CM

## 2020-08-31 DIAGNOSIS — Z1329 Encounter for screening for other suspected endocrine disorder: Secondary | ICD-10-CM

## 2020-08-31 DIAGNOSIS — E559 Vitamin D deficiency, unspecified: Secondary | ICD-10-CM

## 2020-08-31 DIAGNOSIS — Z1321 Encounter for screening for nutritional disorder: Secondary | ICD-10-CM

## 2020-08-31 LAB — LIPID PANEL
Cholesterol: 213 mg/dL — ABNORMAL HIGH (ref <200)
HDL: 91 mg/dL (ref >=50)
LDL Cholesterol (Calc): 108 mg/dL (calc) — ABNORMAL HIGH
Non-HDL Cholesterol (Calc): 122 mg/dL (calc) (ref <130)
Total CHOL/HDL Ratio: 2.3 (calc) (ref <5.0)
Triglycerides: 56 mg/dL (ref <150)

## 2020-08-31 LAB — CBC WITH DIFFERENTIAL/PLATELET
Absolute Monocytes: 451 cells/uL (ref 200–950)
Basophils Absolute: 19 cells/uL (ref 0–200)
Basophils Relative: 0.4 %
Eosinophils Absolute: 38 cells/uL (ref 15–500)
Eosinophils Relative: 0.8 %
HCT: 42.4 % (ref 35.0–45.0)
Hemoglobin: 13.9 g/dL (ref 11.7–15.5)
Lymphs Abs: 1709 cells/uL (ref 850–3900)
MCH: 30.5 pg (ref 27.0–33.0)
MCHC: 32.8 g/dL (ref 32.0–36.0)
MCV: 93 fL (ref 80.0–100.0)
MPV: 9.7 fL (ref 7.5–12.5)
Monocytes Relative: 9.4 %
Neutro Abs: 2582 cells/uL (ref 1500–7800)
Neutrophils Relative %: 53.8 %
Platelets: 307 10*3/uL (ref 140–400)
RBC: 4.56 10*6/uL (ref 3.80–5.10)
RDW: 13 % (ref 11.0–15.0)
Total Lymphocyte: 35.6 %
WBC: 4.8 10*3/uL (ref 3.8–10.8)

## 2020-08-31 LAB — COMPLETE METABOLIC PANEL WITH GFR
AG Ratio: 1.8 (calc) (ref 1.0–2.5)
ALT: 14 U/L (ref 6–29)
AST: 19 U/L (ref 10–30)
Albumin: 4.8 g/dL (ref 3.6–5.1)
Alkaline phosphatase (APISO): 43 U/L (ref 31–125)
BUN: 10 mg/dL (ref 7–25)
CO2: 29 mmol/L (ref 20–32)
Calcium: 10 mg/dL (ref 8.6–10.2)
Chloride: 103 mmol/L (ref 98–110)
Creat: 0.62 mg/dL (ref 0.50–1.10)
GFR, Est African American: 132 mL/min/{1.73_m2} (ref >=60)
GFR, Est Non African American: 114 mL/min/{1.73_m2} (ref >=60)
Globulin: 2.6 g/dL (calc) (ref 1.9–3.7)
Glucose, Bld: 91 mg/dL (ref 65–99)
Potassium: 4.9 mmol/L (ref 3.5–5.3)
Sodium: 140 mmol/L (ref 135–146)
Total Bilirubin: 0.6 mg/dL (ref 0.2–1.2)
Total Protein: 7.4 g/dL (ref 6.1–8.1)

## 2020-08-31 LAB — VITAMIN D 25 HYDROXY (VIT D DEFICIENCY, FRACTURES): Vit D, 25-Hydroxy: 34 ng/mL (ref 30–100)

## 2020-08-31 LAB — TSH: TSH: 0.85 mIU/L

## 2020-09-08 ENCOUNTER — Ambulatory Visit (INDEPENDENT_AMBULATORY_CARE_PROVIDER_SITE_OTHER): Payer: 59 | Admitting: Internal Medicine

## 2020-09-08 ENCOUNTER — Other Ambulatory Visit: Payer: Self-pay

## 2020-09-08 ENCOUNTER — Encounter: Payer: Self-pay | Admitting: Internal Medicine

## 2020-09-08 VITALS — BP 150/100 | HR 78 | Ht 63.0 in | Wt 117.0 lb

## 2020-09-08 DIAGNOSIS — Z0000 Encounter for general adult medical examination without abnormal findings: Secondary | ICD-10-CM

## 2020-09-08 DIAGNOSIS — Z8639 Personal history of other endocrine, nutritional and metabolic disease: Secondary | ICD-10-CM | POA: Diagnosis not present

## 2020-09-08 DIAGNOSIS — Z87442 Personal history of urinary calculi: Secondary | ICD-10-CM

## 2020-09-08 LAB — POCT URINALYSIS DIPSTICK
Appearance: NEGATIVE
Bilirubin, UA: NEGATIVE
Blood, UA: NEGATIVE
Glucose, UA: NEGATIVE
Ketones, UA: NEGATIVE
Leukocytes, UA: NEGATIVE
Nitrite, UA: NEGATIVE
Odor: NEGATIVE
Protein, UA: NEGATIVE
Spec Grav, UA: 1.01 (ref 1.010–1.025)
Urobilinogen, UA: 0.2 E.U./dL
pH, UA: 6.5 (ref 5.0–8.0)

## 2020-09-08 MED ORDER — ERGOCALCIFEROL 1.25 MG (50000 UT) PO CAPS: 50000.0000 [IU] | ORAL_CAPSULE | ORAL | 0 refills | Status: DC

## 2020-09-20 NOTE — Progress Notes (Signed)
   Subjective:    Patient ID: Mia Nguyen, female    DOB: 11-02-1980, 40 y.o.   MRN: [id number]  HPI 40 year old Female seen for health maintenance exam.  Her general health is excellent.  She has a history of attention deficit issues.  Has tried Vyvanse.  History of vitamin D deficiency.  Level last year was 21.  Level currently is 34.  I would like her to stay on high-dose vitamin D weekly.  History of coccydynia in 2016 treated by Dr. Hulan Saas.  Needs Tdap vaccine but would like to defer it today.  She has had 3 Moderna vaccines.  History of bilateral nonobstructing nephrolithiasis on CT scan in the emergency department September 2019.  She saw urologist who felt she had passed the stone.  In 09-24-2019 she had an ESBL UTI and was basically asymptomatic.  Organism was sensitive to Macrobid and she was treated with this for 10 days.  Subsequently was placed on fosfomycin and has not had recurrence of ESBL UTI.  Subsequently in June 2021 she had a kidney stone while vacationing in [place] 6 mm stone affecting right ureter.  She was able to pass the stone and collect it.  I do not see where we received it to the have it analyzed.  Patient requested evaluation at [facility] kidney stone clinic.  Stone was analyzed at [facility] and [place] report for it was a percent calcium phosphate, 10% calcium oxalate monohydrate and 10% calcium oxalate dihydrate.  Patient has subsequently had no recurrence.  Occasionally gets ear infections.  She is allergic to sulfa causes a rash.  Family history: History of kidney stones in father.  Hypertension in mother.  Social history: Married with 3 children, 2 sons and a daughter.  [place]usband is [place].  She does not smoke.  Social alcohol consumption.  She went to stone center at [place] no new complaints     Objective:   Physical Exam Blood pressure 150/100.  Pulse 78 pulse oximetry 97% weight 117 pounds height 5 feet 3  inches days she tends to have office hypertension.  She needs to check blood pressure at home and make sure it is normal.  Skin warm and dry.  Nodes none.  TMs clear.  Neck supple.  Chest clear.  Abdomen soft nondistended without hepatosplenomegaly masses or tenderness.  Cardiac exam: Regular rate and rhythm without murmurs or gallops.  No lower extremity pitting edema.  Neuro: Intact without focal deficits.  Affect thought and judgment are normal.  GYN exam deferred to GYN physician.       Assessment & Plan:  History of vitamin D deficiency-I think she should stay on high-dose vitamin D weekly  History of kidney stones-none recently.  Best option is to avoid tea and colas and stay well-hydrated.  Analysis of stone as described above was basically calcium oxalate stone.  Office hypertension-monitor blood pressure at home and let me know if elevated  Plan: Return in 1 year or as needed.  Recommend baseline mammogram at age 50.  Keep COVID vaccines current per recommendations.  Recommend tetanus immunization.

## 2020-09-20 NOTE — Patient Instructions (Addendum)
It was a pleasure to see you today.  Continue vitamin D supplementation.  Stay well-hydrated and avoid taking colas due to history of calcium oxalate kidney stone.  Tetanus immunization is needed.  Please return when it is convenient for you to have this immunization.  Return in 1 year or as needed.

## 2020-11-27 ENCOUNTER — Other Ambulatory Visit: Payer: Self-pay | Admitting: Internal Medicine

## 2020-12-26 DIAGNOSIS — N200 Calculus of kidney: Secondary | ICD-10-CM

## 2020-12-26 HISTORY — DX: Calculus of kidney: N20.0

## 2021-05-12 DIAGNOSIS — J358 Other chronic diseases of tonsils and adenoids: Secondary | ICD-10-CM | POA: Insufficient documentation

## 2021-06-21 ENCOUNTER — Ambulatory Visit
Admission: RE | Admit: 2021-06-21 | Discharge: 2021-06-21 | Disposition: A | Discharge status: Home / Self Care | Payer: 59 | Source: Ambulatory Visit | Attending: Family Medicine | Admitting: Family Medicine

## 2021-06-21 ENCOUNTER — Encounter: Payer: Self-pay | Admitting: Family Medicine

## 2021-06-21 ENCOUNTER — Ambulatory Visit: Payer: 59 | Admitting: Family Medicine

## 2021-06-21 VITALS — BP 148/94 | Ht 63.0 in | Wt 120.0 lb

## 2021-06-21 DIAGNOSIS — G8929 Other chronic pain: Secondary | ICD-10-CM

## 2021-06-21 DIAGNOSIS — M545 Low back pain, unspecified: Secondary | ICD-10-CM

## 2021-06-21 MED ORDER — MELOXICAM 15 MG PO TABS: 15.0000 mg | ORAL_TABLET | Freq: Every day | ORAL | 1 refills | Status: DC

## 2021-06-21 NOTE — Progress Notes (Signed)
PCP: Elby Showers, MD  Subjective:   HPI: Patient is a 42 y.o. female here for low back pain.  Patient reports she's had history of low back pain dating back about 8-9 years. She was diagnosed with a sacrum fracture and soon after noted she was pregnant. Intermittently had low back pain since though current pain really worsened about 1.5 weeks ago. No radiation into legs. No bowel/bladder dysfunction. Fells like band across low back. No rash. Has tried heat, ibuprofen, topical cream with mild benefit. Some localized numbness in back.  Past Medical History:  Diagnosis Date   History of headache 07/31/2013   Postpartum care following vaginal delivery (07/29/13) 04/20/2011   Pregnancy induced hypertension    post partum with last pregnancy   SVD (spontaneous vaginal delivery) 07/30/2013    Current Outpatient Medications on File Prior to Visit  Medication Sig Dispense Refill   Vitamin D, Ergocalciferol, (DRISDOL) 1.25 MG (50000 UNIT) CAPS capsule TAKE 1 CAPSULE BY MOUTH ONE TIME PER WEEK 12 capsule 0   VYVANSE 20 MG capsule Take 20 mg by mouth every morning.     No current facility-administered medications on file prior to visit.    History reviewed. No pertinent surgical history.  Allergies  Allergen Reactions   Sulfa Antibiotics Other (See Comments)    "white heads all over"    BP (!) 148/94    Ht 5\' 3"  (1.6 m)    Wt 120 lb (54.4 kg)    BMI 21.26 kg/m   [facility] 06/21/2021  Frequency of aerobic exercise (# of days/week) 2  Average time in minutes 0  Frequency of strengthening activities (# of days/week) 0    No flowsheet data found.      Objective:  Physical Exam:  Gen: NAD, comfortable in exam room  Back: No gross deformity, scoliosis. No midline or bony TTP.  No SI joint, gluteal, trochanter tenderness. FROM with pain on flexion and extension. Strength LEs 5/5 all muscle groups.   2+ MSRs in patellar and achilles tendons, equal  bilaterally. Negative SLRs. Sensation intact to light touch bilaterally.  Bilateral hips: No deformity. FROM with 5/5 strength. No tenderness to palpation. NVI distally. Negative logroll Negative faber, fadir, and piriformis stretches.   Assessment & Plan:  1. Low back pain - present for over 8 years though worse in past 1.5 weeks.  Pain localized to low back without radiation into extremities.  Will obtain radiographs and given length of symptoms will proceed with MRI if those are normal to assess for disc herniation.  Start meloxicam in meantime.

## 2021-06-21 NOTE — Patient Instructions (Signed)
Get x-rays after you leave today - we will call you with results. If these are normal consider physical therapy or an MRI of your lumbar spine given how long you've had these symptoms. Start meloxicam 15mg  daily with food for pain and inflammation - don't take aleve or ibuprofen with this.

## 2021-08-11 ENCOUNTER — Telehealth: Payer: Self-pay

## 2021-08-11 NOTE — Telephone Encounter (Signed)
Appt made for Friday  °

## 2021-08-11 NOTE — Telephone Encounter (Signed)
Patient is asking for an appt to discuss her blood pressure. She states it has been up for a while and another doctor told her to follow up. Please advise.  ?

## 2021-08-13 ENCOUNTER — Ambulatory Visit: Payer: 59 | Admitting: Internal Medicine

## 2021-08-13 ENCOUNTER — Encounter: Payer: Self-pay | Admitting: Internal Medicine

## 2021-08-13 VITALS — BP 136/90 | HR 66 | Temp 98.3°F | Wt 124.0 lb

## 2021-08-13 DIAGNOSIS — Z8659 Personal history of other mental and behavioral disorders: Secondary | ICD-10-CM

## 2021-08-13 DIAGNOSIS — Z87442 Personal history of urinary calculi: Secondary | ICD-10-CM

## 2021-08-13 NOTE — Progress Notes (Signed)
? ?  Subjective:  ? ? Patient ID: Mia Nguyen, female    DOB: February 14, 1981, 41 y.o.   MRN: [id number] ? ?HPI Very pleasant 41 year old Female seen today to discuss attention deficit issues.  She was started on attention deficit medication about 3 years ago by Mia Nguyen at [facility].  She is currently on Vyvanse.  Blood pressure has been elevated recently.  In early March, Mia Nguyen informed me that patient's blood pressure was elevated in the office at 155/105, 145/102 and 170/105.  She was told to hold Vyvanse through the Easter weekend. ? ?There is a family history of hypertension in mother, paternal grandmother and father. ? ?Has been on Vyvanse 20 mg daily in the morning. ? ?Patient says she has issues getting organized and that this medicine has helped her a great deal so she has been reluctant to stop taking it. ? ?Patient has a history of right nephrolithiasis in June 2021.  History of bilateral nonobstructing nephrolithiasis on CT scan September 2019. ? ?She did go to [facility] clinic regarding kidney stones in the Fall 2022 as well as [facility] in 2019.  At [facility] urology clinic PTH and BUN were normal. ? ?History of right kidney stone 6 mm seen at [facility] in [place] while she was on vacation at the beach in June 2021. ? ?Review of Systems see Mia Nguyen is concerned about attention issues if she has to stop taking Vyvanse.  Explained that there are other options.  1 option would be Strattera although it might not be quite as effective. ? ?   ?Objective:  ? Physical Exam ?Blood pressure 136/90 pulse 66 temperature 98.3 degrees pulse oximetry 98% BMI 21.97 ? ?Skin warm and dry.  No cervical adenopathy, thyromegaly or carotid bruits.  Chest is clear to auscultation.  Cardiac exam: Regular rate and rhythm without murmur or ectopy.  No lower extremity pitting edema. ? ? ? ?   ?Assessment & Plan:  ?Mild elevation of blood pressure on Vyvanse ? ?History of kidney stones seen at  [facility] clinic ? ?Plan: She is to stop Vyvanse and return the week of April 24 for evaluation and blood pressure readings to be reviewed. ? ?

## 2021-08-19 ENCOUNTER — Other Ambulatory Visit: Payer: 59

## 2021-08-19 DIAGNOSIS — Z136 Encounter for screening for cardiovascular disorders: Secondary | ICD-10-CM

## 2021-08-19 DIAGNOSIS — R5383 Other fatigue: Secondary | ICD-10-CM

## 2021-08-19 DIAGNOSIS — E559 Vitamin D deficiency, unspecified: Secondary | ICD-10-CM

## 2021-08-19 DIAGNOSIS — R030 Elevated blood-pressure reading, without diagnosis of hypertension: Secondary | ICD-10-CM

## 2021-08-23 ENCOUNTER — Telehealth: Payer: Self-pay | Admitting: Internal Medicine

## 2021-08-23 NOTE — Telephone Encounter (Signed)
Called back and scheduled

## 2021-08-23 NOTE — Telephone Encounter (Signed)
LVM to CB to reschedule CPE, not dur till after 09/08/2021, needs labs prior ?

## 2021-08-25 ENCOUNTER — Encounter: Payer: 59 | Admitting: Internal Medicine

## 2021-08-25 NOTE — Patient Instructions (Signed)
Stop Vyvanse and continue to monitor blood pressure readings.  Return in late April for follow-up. ?

## 2021-08-26 ENCOUNTER — Other Ambulatory Visit: Payer: 59

## 2021-08-26 DIAGNOSIS — Z136 Encounter for screening for cardiovascular disorders: Secondary | ICD-10-CM

## 2021-08-26 DIAGNOSIS — R030 Elevated blood-pressure reading, without diagnosis of hypertension: Secondary | ICD-10-CM

## 2021-08-26 DIAGNOSIS — R5383 Other fatigue: Secondary | ICD-10-CM

## 2021-08-27 ENCOUNTER — Ambulatory Visit: Payer: 59 | Admitting: Internal Medicine

## 2021-08-27 ENCOUNTER — Encounter: Payer: Self-pay | Admitting: Internal Medicine

## 2021-08-27 VITALS — BP 140/90

## 2021-08-27 DIAGNOSIS — Z8659 Personal history of other mental and behavioral disorders: Secondary | ICD-10-CM

## 2021-08-27 DIAGNOSIS — R030 Elevated blood-pressure reading, without diagnosis of hypertension: Secondary | ICD-10-CM

## 2021-08-27 DIAGNOSIS — R82998 Other abnormal findings in urine: Secondary | ICD-10-CM | POA: Diagnosis not present

## 2021-08-27 DIAGNOSIS — Z87442 Personal history of urinary calculi: Secondary | ICD-10-CM | POA: Diagnosis not present

## 2021-08-27 DIAGNOSIS — Z7184 Encounter for health counseling related to travel: Secondary | ICD-10-CM

## 2021-08-27 LAB — COMPLETE METABOLIC PANEL WITH GFR
AG Ratio: 1.7 (calc) (ref 1.0–2.5)
ALT: 20 U/L (ref 6–29)
AST: 20 U/L (ref 10–30)
Albumin: 4.9 g/dL (ref 3.6–5.1)
Alkaline phosphatase (APISO): 47 U/L (ref 31–125)
BUN: 10 mg/dL (ref 7–25)
CO2: 28 mmol/L (ref 20–32)
Calcium: 10 mg/dL (ref 8.6–10.2)
Chloride: 103 mmol/L (ref 98–110)
Creat: 0.73 mg/dL (ref 0.50–0.99)
Globulin: 2.9 g/dL (calc) (ref 1.9–3.7)
Glucose, Bld: 77 mg/dL (ref 65–99)
Potassium: 4.8 mmol/L (ref 3.5–5.3)
Sodium: 141 mmol/L (ref 135–146)
Total Bilirubin: 0.9 mg/dL (ref 0.2–1.2)
Total Protein: 7.8 g/dL (ref 6.1–8.1)
eGFR: 107 mL/min/{1.73_m2} (ref >=60)

## 2021-08-27 LAB — CBC WITH DIFFERENTIAL/PLATELET
Absolute Monocytes: 473 cells/uL (ref 200–950)
Basophils Absolute: 38 cells/uL (ref 0–200)
Basophils Relative: 0.6 %
Eosinophils Absolute: 38 cells/uL (ref 15–500)
Eosinophils Relative: 0.6 %
HCT: 45.7 % — ABNORMAL HIGH (ref 35.0–45.0)
Hemoglobin: 15.3 g/dL (ref 11.7–15.5)
Lymphs Abs: 1840 cells/uL (ref 850–3900)
MCH: 31.8 pg (ref 27.0–33.0)
MCHC: 33.5 g/dL (ref 32.0–36.0)
MCV: 95 fL (ref 80.0–100.0)
MPV: 10 fL (ref 7.5–12.5)
Monocytes Relative: 7.5 %
Neutro Abs: 3912 cells/uL (ref 1500–7800)
Neutrophils Relative %: 62.1 %
Platelets: 329 10*3/uL (ref 140–400)
RBC: 4.81 10*6/uL (ref 3.80–5.10)
RDW: 12.1 % (ref 11.0–15.0)
Total Lymphocyte: 29.2 %
WBC: 6.3 10*3/uL (ref 3.8–10.8)

## 2021-08-27 LAB — LIPID PANEL
Cholesterol: 199 mg/dL (ref <200)
HDL: 82 mg/dL (ref >=50)
LDL Cholesterol (Calc): 103 mg/dL (calc) — ABNORMAL HIGH
Non-HDL Cholesterol (Calc): 117 mg/dL (calc) (ref <130)
Total CHOL/HDL Ratio: 2.4 (calc) (ref <5.0)
Triglycerides: 55 mg/dL (ref <150)

## 2021-08-27 LAB — POCT URINALYSIS DIPSTICK
Bilirubin, UA: NEGATIVE
Glucose, UA: NEGATIVE
Ketones, UA: 40
Nitrite, UA: NEGATIVE
Protein, UA: NEGATIVE
Spec Grav, UA: 1.01 (ref 1.010–1.025)
Urobilinogen, UA: 0.2 E.U./dL
pH, UA: 7.5 (ref 5.0–8.0)

## 2021-08-27 LAB — TSH: TSH: 0.83 mIU/L

## 2021-08-27 NOTE — Patient Instructions (Addendum)
EKG is within normal limits.  Continue to monitor blood pressure off attention deficit medication.  There are options for attention deficit medications that are not likely to elevate your blood pressure if you absolutely need them.  Consider referral to infectious disease travel clinic for trip to [place].  Suggesting Dr. Al Pimple see her regarding blood pressure issues and evaluation.  May benefit from coronary calcium scoring.  Return in September.  A urine culture has been ordered due to leukocytes in urine but I doubt she has UTI. ?

## 2021-08-27 NOTE — Progress Notes (Signed)
? ?Subjective:  ? ? Patient ID: Mia Nguyen, female    DOB: December 19, 1980, 41 y.o.   MRN: [id number] ? ?HPI 41 year old Female seen today for follow up.  She was seen April 14 with history of attention deficit disorder and had noticed her blood pressure has been elevated.  She was seen by Karlton Lemon for back pain without sciatica in February 2023 and blood pressure was 148/94.  Patient has been taking Vyvanse 20 mg daily per Valrie Hart, nurse practitioner for attention deficit issues.  Says she gets distracted easily and has trouble managing her daily without this medication but is concerned because her blood pressure has been elevated. ? ?She had labs drawn yesterday and her TSH is normal.  Mild elevation of LDL at 103 and was 108 12 months ago.  However her total cholesterol is 199, HDL 82 and triglycerides 55.  TSH is normal.  C-Met is normal.  CBC is within normal limits. ? ?She says been doing okay off of Vyvanse.  Has had some minor issues with attention deficit but is managing pretty well. ? ?History of vitamin D deficiency but level in 22-Sep-2020 was within normal limits at 34. ? ?History of bilateral nonobstructing nephrolithiasis on CT scan in the emergency department September 2019.  She saw urologist who felt she had passed the stone. ? ?In 09-23-19 she had ESBL UTI but was asymptomatic.  Organism was sensitive to Macrobid and she was treated with this medication for 10 days.  Subsequently was placed on fosfomycin and has not had recurrence of ESBL UTI. ? ?In June 2021 had kidney stone while on vacation in [place].  This was a 6 mm stone affecting right ureter.  She was able to pass the stone and collected. ? ?She has also been seen at [facility] kidney stone clinic.  Stone was analyzed at [facility] and report indicated stone contained calcium phosphate and calcium oxalate.  She stays well-hydrated and has had no recurrence. ? ?She is allergic to sulfa-it causes a rash. ? ?Family history: Kidney  stones in father.  Hypertension in mother. ? ?Social history: She is married.  She has 2 sons and a daughter.  She does not smoke.  Social alcohol consumption. ? ?She has a history of vitamin D deficiency but currently is not on vitamin D weekly. ? ?In 09-22-2020 her blood pressure was elevated at 150/100 and pulse was 78 and regular.  She is not overweight.  I thought that blood pressure might be elevated due to office hypertension.  However seems to be elevated outside of this office as well. ? ?We have talked about her blood pressure elevation and family history of hypertension.  I think it would be wise for her to see Dr. Al Pimple for evaluation.  She is agreeable to seeing Dr. Harrell Gave.  She will keep records of her blood pressure in the interim before her appointment with Dr. Harrell Gave. ? ? ?CBC and c-Met drawn yesterday are normal.  Has elevated LDL of 103. ? ?Dipstick UA today shows trace occult blood and trace LE.  Culture was sent.  She is asymptomatic.  Specific gravity is 1.010.  No protein in urine. ? ? ?Review of Systems going to continue this Summer with family.  May need travel advice for this. ? ?   ?Objective:  ? Physical Exam ?Blood pressure today 140/90 ?Skin: Warm and dry.  TMs are clear.  Pharynx is clear.  Neck is supple without  JVD thyromegaly or carotid bruits.  No thyromegaly.  Chest is clear to auscultation.  Cardiac exam: Regular rate and rhythm without ectopy or murmur.  Abdomen: No hepatosplenomegaly.  No lower extremity pitting edema.  Affect thought and judgment are normal and there are no gross focal deficits on brief neurological exam. ? ? ?   ?Assessment & Plan:  ?Elevated blood pressure reading despite being off Vyvanse.  May have element of essential hypertension versus office hypertension.  Would like for her to keep blood pressure readings at home. ? ?I think she would benefit from consultation with Dr. Al Pimple.  She might also benefit from coronary  calcium scoring. ? ?Travel advice form trip to [place]. She may need malaria prophylaxis.  She is going to need a tetanus immunization update since last one on file was 2012. May be wise to check with travel agency and we can make referral to Infectious Disease Clinic for travel advice encounter. Have reviewed CDC website on-line. ? ?

## 2021-08-30 LAB — URINE CULTURE
MICRO NUMBER:: 13326357
SPECIMEN QUALITY:: ADEQUATE

## 2021-10-26 ENCOUNTER — Telehealth: Payer: Self-pay | Admitting: Internal Medicine

## 2021-10-26 ENCOUNTER — Encounter: Payer: Self-pay | Admitting: Internal Medicine

## 2021-10-26 ENCOUNTER — Ambulatory Visit: Payer: 59 | Admitting: Internal Medicine

## 2021-10-26 VITALS — BP 142/90 | HR 70 | Temp 98.4°F | Ht 63.0 in | Wt 125.8 lb

## 2021-10-26 DIAGNOSIS — R030 Elevated blood-pressure reading, without diagnosis of hypertension: Secondary | ICD-10-CM

## 2021-10-26 MED ORDER — AMLODIPINE BESYLATE 2.5 MG PO TABS: 2.5000 mg | ORAL_TABLET | Freq: Every day | ORAL | 0 refills | Status: DC

## 2021-10-26 MED ORDER — ALPRAZOLAM 0.25 MG PO TABS: ORAL_TABLET | ORAL | 0 refills | Status: DC

## 2021-10-26 NOTE — Telephone Encounter (Signed)
Mia Nguyen [phone]  Purvis Kilts called to say she was just leaving Gyn and they were concerned because while she was there, they took her blood pressure twice and it was 155/111 and then they took it again and it was 160/104. He instructed her to call you. They are going out of country soon and feels it is best to see you to get this under control before leaving. She does not have an appointment with cardiologist until August.

## 2021-12-13 ENCOUNTER — Encounter (HOSPITAL_BASED_OUTPATIENT_CLINIC_OR_DEPARTMENT_OTHER): Payer: Self-pay | Admitting: *Deleted

## 2021-12-15 ENCOUNTER — Encounter (HOSPITAL_BASED_OUTPATIENT_CLINIC_OR_DEPARTMENT_OTHER): Payer: Self-pay

## 2021-12-15 DIAGNOSIS — O139 Gestational [pregnancy-induced] hypertension without significant proteinuria, unspecified trimester: Secondary | ICD-10-CM | POA: Insufficient documentation

## 2021-12-21 ENCOUNTER — Encounter (HOSPITAL_BASED_OUTPATIENT_CLINIC_OR_DEPARTMENT_OTHER): Payer: Self-pay | Admitting: Cardiology

## 2021-12-21 ENCOUNTER — Ambulatory Visit (HOSPITAL_BASED_OUTPATIENT_CLINIC_OR_DEPARTMENT_OTHER): Payer: 59 | Admitting: Cardiology

## 2021-12-21 VITALS — BP 130/98 | HR 63 | Ht 63.0 in | Wt 131.5 lb

## 2021-12-21 DIAGNOSIS — Z7189 Other specified counseling: Secondary | ICD-10-CM | POA: Diagnosis not present

## 2021-12-21 DIAGNOSIS — Z8249 Family history of ischemic heart disease and other diseases of the circulatory system: Secondary | ICD-10-CM

## 2021-12-21 DIAGNOSIS — I10 Essential (primary) hypertension: Secondary | ICD-10-CM | POA: Diagnosis not present

## 2021-12-21 MED ORDER — AMLODIPINE BESYLATE 5 MG PO TABS: 5.0000 mg | ORAL_TABLET | Freq: Every day | ORAL | 3 refills | Status: DC

## 2021-12-21 NOTE — Progress Notes (Signed)
Cardiology Office Note:    Date:  12/21/2021   ID:  Mia Nguyen, DOB 01-08-1981, MRN [id number]  PCP:  Elby Showers, MD  Cardiologist:  Buford Dresser, MD  Referring MD: Elby Showers, MD   CC: new patient consultation for hypertension  History of Present Illness:    Mia Nguyen is a 41 y.o. female with a hx of pregnancy-induced hypertension, and nephrolithiasis, who is seen as a new consult at the request of Baxley, Cresenciano Lick, MD for the evaluation and management of elevated blood pressure.  She saw Dr. Renold Genta 08/27/21 and was found to have elevated blood pressure despite being off Vyvanse. It was felt she may have an element of essential hypertension versus office hypertension. She was referred to cardiology for further evaluation.  She followed up with Dr. Renold Genta on 10/26/2021 and her amlodipine was increased to 2.5 mg daily.  Cardiovascular risk factors: Prior clinical ASCVD: None. Comorbid conditions: Hypertension -  Initially 2011 post-partum. She was started on amlodipine about 1 month ago. Her blood pressure was 155/110 and 160/104 at her recent Ob/Gyn visit. Typically she would deny white coat hypertension, although she may rush to her appointments. She denies any kidney disease. She has had kidney stones in the past. Metabolic syndrome/Obesity: She is concerned about rapid weight gain since she was started on amlodipine. She gained 5 lbs in a few weeks. Of note, she recently was taken off of an ADD medication. Earlier this year she reports her weight was closer to 115 lbs consistently. In clinic today she is 131 lb. Tobacco use history: Never. Family history: Her mother and father have hypertension (started antihypertensives at 62 and 41 yo respectively). There is a family history of strokes on her paternal side. Prior cardiac testing and/or incidental findings on other testing (ie coronary calcium): Exercise level: Walks her dog around the block for 10  minutes a few times a week. Sometimes participates in a yoga class weekly. Current diet: Quick breakfast, such as egg, toast, granola. Lunch may be heavy or light depending on the day. She considers her diet to be relatively healthy. Occasionally orders out. Usually has a couple cups of coffee daily.  Occasionally she feels heart flutters. This occurs weekly for a quick duration, but not daily.  Lately she has noticed swelling in her fingers, making it difficult to remove her rings.   She denies any chest pain, shortness of breath. No lightheadedness, headaches, syncope, orthopnea, or PND.  Past Medical History:  Diagnosis Date   History of headache 07/31/2013   Kidney stone 12/26/2020   Postpartum care following vaginal delivery (07/29/13) 04/20/2011   Pregnancy induced hypertension    post partum with last pregnancy   Single umbilical artery, maternal, antepartum 04/11/2013   SVD (spontaneous vaginal delivery) 07/30/2013    No past surgical history on file.  Current Medications: No current outpatient medications on file prior to visit.   No current facility-administered medications on file prior to visit.     Allergies:   Sulfa antibiotics   Social History   Tobacco Use   Smoking status: Never   Smokeless tobacco: Never  Substance Use Topics   Alcohol use: Yes   Drug use: No    Family History: family history includes Cancer in her cousin and maternal grandfather; Hypertension in her maternal grandmother and mother; Stroke in her paternal grandfather.  ROS:   Please see the history of present illness.  Additional pertinent ROS: Constitutional: Negative for chills,  fever, night sweats, unintentional weight loss. Positive for weight gain.  HENT: Negative for ear pain and hearing loss.   Eyes: Negative for loss of vision and eye pain.  Respiratory: Negative for cough, sputum, wheezing.   Cardiovascular: See HPI. Gastrointestinal: Negative for abdominal pain, melena, and  hematochezia.  Genitourinary: Negative for dysuria and hematuria.  Musculoskeletal: Negative for falls and myalgias.  Skin: Negative for itching and rash.  Neurological: Negative for focal weakness, focal sensory changes and loss of consciousness.  Endo/Heme/Allergies: Does not bleed easily. Positive for bruising easily.   EKGs/Labs/Other Studies Reviewed:    The following studies were reviewed today:  No prior cardiovascular studies available.   EKG:  EKG is personally reviewed.   12/21/2021:  NSR at 63 bpm  Recent Labs: 08/26/2021: ALT 20; BUN 10; Creat 0.73; Hemoglobin 15.3; Platelets 329; Potassium 4.8; Sodium 141; TSH 0.83   Recent Lipid Panel    Component Value Date/Time   CHOL 199 08/26/2021 0946   TRIG 55 08/26/2021 0946   HDL 82 08/26/2021 0946   CHOLHDL 2.4 08/26/2021 0946   LDLCALC 103 (H) 08/26/2021 0946    Physical Exam:    VS:  BP (!) 130/98 (BP Location: Right Arm, Patient Position: Sitting, Cuff Size: Normal)   Pulse 63   Ht '5\' 3"'$  (1.6 m)   Wt 131 lb 8 oz (59.6 kg)   BMI 23.29 kg/m     Wt Readings from Last 3 Encounters:  12/21/21 131 lb 8 oz (59.6 kg)  10/26/21 125 lb 12 oz (57 kg)  08/13/21 124 lb (56.2 kg)    GEN: Well nourished, well developed in no acute distress HEENT: Normal, moist mucous membranes NECK: No JVD CARDIAC: regular rhythm, normal S1 and S2, no rubs or gallops. No murmur. VASCULAR: Radial and DP pulses 2+ bilaterally. No carotid bruits RESPIRATORY:  Clear to auscultation without rales, wheezing or rhonchi  ABDOMEN: Soft, non-tender, non-distended MUSCULOSKELETAL:  Ambulates independently SKIN: Warm and dry, no edema NEUROLOGIC:  Alert and oriented x 3. No focal neuro deficits noted. PSYCHIATRIC:  Normal affect    ASSESSMENT:    1. Primary hypertension   2. Cardiac risk counseling   3. Counseling on health promotion and disease prevention   4. Family history of essential hypertension    PLAN:    Hypertension -diastolic  number significantly above goal, systolic at/near goal -currently on amlodipine 2.5 mg daily -will increase amlodipine to 5 mg daily to assist with diastolic pressure -discussed recommendations for salt avoidance, exercise -discussed how to check home BP  Cardiac risk counseling and prevention recommendations: -recommend heart healthy/Mediterranean diet, with whole grains, fruits, vegetable, fish, lean meats, nuts, and olive oil. Limit salt. -recommend moderate walking, 3-5 times/week for 30-50 minutes each session. Aim for at least 150 minutes.week. Goal should be pace of 3 miles/hours, or walking 1.5 miles in 30 minutes -recommend avoidance of tobacco products. Avoid excess alcohol. -ASCVD risk score: The 10-year ASCVD risk score (Arnett DK, et al., 2019) is: 0.4%   Values used to calculate the score:     Age: 89 years     Sex: Female     Is Non-Hispanic African American: No     Diabetic: No     Tobacco smoker: No     Systolic Blood Pressure: 678 mmHg     Is BP treated: Yes     HDL Cholesterol: 82 mg/dL     Total Cholesterol: 199 mg/dL    Plan for follow up: 2  months or sooner as needed.  Buford Dresser, MD, PhD, [facility]    Medication Adjustments/Labs and Tests Ordered: Current medicines are reviewed at length with the patient today.  Concerns regarding medicines are outlined above.   Orders Placed This Encounter  Procedures   EKG 12-Lead   Meds ordered this encounter  Medications   amLODipine (NORVASC) 5 MG tablet    Sig: Take 1 tablet (5 mg total) by mouth daily.    Dispense:  90 tablet    Refill:  3    Patient will call when ready for refill   Patient Instructions  Medication Instructions:  INCREASE: Amlodipine to 5 mg daily  *If you need a refill on your cardiac medications before your next appointment, please call your pharmacy*   Lab Work: None ordered today   Testing/Procedures: None ordered today   Follow-Up: At  [facility], you and your health needs are our priority.  As part of our continuing mission to provide you with exceptional heart care, we have created designated Provider Care Teams.  These Care Teams include your primary Cardiologist (physician) and Advanced Practice Providers (APPs -  Physician Assistants and Nurse Practitioners) who all work together to provide you with the care you need, when you need it.  We recommend signing up for the patient portal called "MyChart".  Sign up information is provided on this After Visit Summary.  MyChart is used to connect with patients for Virtual Visits (Telemedicine).  Patients are able to view lab/test results, encounter notes, upcoming appointments, etc.  Non-urgent messages can be sent to your provider as well.   To learn more about what you can do with MyChart, go to [email or web address].    Your next appointment:   2 month(s)  The format for your next appointment:   In Person  Provider:   Buford Dresser, MD{   how to check blood pressure:  -sit comfortably in a chair, feet uncrossed and flat on floor, for 5-10 minutes  -arm ideally should rest at the level of the heart. However, arm should be relaxed and not tense (for example, do not hold the arm up unsupported)  -avoid exercise, caffeine, and tobacco for at least 30 minutes prior to BP reading  -don't take BP cuff reading over clothes (always place on skin directly)  -I prefer to know how well the medication is working, so I would like you to take your readings 1-2 hours after taking your blood pressure medication if possible    I,Mathew Stumpf,acting as a scribe for [employer], MD.,have documented all relevant documentation on the behalf of Buford Dresser, MD,as directed by  Buford Dresser, MD while in the presence of Buford Dresser, MD.  I, Buford Dresser, MD, have reviewed all documentation for this visit. The documentation on 12/21/21  for the exam, diagnosis, procedures, and orders are all accurate and complete.   Signed, Buford Dresser, MD PhD 12/21/2021 7:26 PM    [place]

## 2021-12-21 NOTE — Patient Instructions (Addendum)
Medication Instructions:  INCREASE: Amlodipine to 5 mg daily  *If you need a refill on your cardiac medications before your next appointment, please call your pharmacy*   Lab Work: None ordered today   Testing/Procedures: None ordered today   Follow-Up: At [facility], you and your health needs are our priority.  As part of our continuing mission to provide you with exceptional heart care, we have created designated Provider Care Teams.  These Care Teams include your primary Cardiologist (physician) and Advanced Practice Providers (APPs -  Physician Assistants and Nurse Practitioners) who all work together to provide you with the care you need, when you need it.  We recommend signing up for the patient portal called "MyChart".  Sign up information is provided on this After Visit Summary.  MyChart is used to connect with patients for Virtual Visits (Telemedicine).  Patients are able to view lab/test results, encounter notes, upcoming appointments, etc.  Non-urgent messages can be sent to your provider as well.   To learn more about what you can do with MyChart, go to [email or web address].    Your next appointment:   2 month(s)  The format for your next appointment:   In Person  Provider:   Buford Dresser, MD{   how to check blood pressure:  -sit comfortably in a chair, feet uncrossed and flat on floor, for 5-10 minutes  -arm ideally should rest at the level of the heart. However, arm should be relaxed and not tense (for example, do not hold the arm up unsupported)  -avoid exercise, caffeine, and tobacco for at least 30 minutes prior to BP reading  -don't take BP cuff reading over clothes (always place on skin directly)  -I prefer to know how well the medication is working, so I would like you to take your readings 1-2 hours after taking your blood pressure medication if possible

## 2021-12-28 ENCOUNTER — Other Ambulatory Visit: Payer: 59

## 2021-12-30 ENCOUNTER — Other Ambulatory Visit: Payer: 59

## 2021-12-30 DIAGNOSIS — R5383 Other fatigue: Secondary | ICD-10-CM

## 2021-12-30 DIAGNOSIS — Z0000 Encounter for general adult medical examination without abnormal findings: Secondary | ICD-10-CM

## 2021-12-31 LAB — COMPLETE METABOLIC PANEL WITH GFR
AG Ratio: 2 (calc) (ref 1.0–2.5)
ALT: 15 U/L (ref 6–29)
AST: 19 U/L (ref 10–30)
Albumin: 5.1 g/dL (ref 3.6–5.1)
Alkaline phosphatase (APISO): 44 U/L (ref 31–125)
BUN: 15 mg/dL (ref 7–25)
CO2: 29 mmol/L (ref 20–32)
Calcium: 10 mg/dL (ref 8.6–10.2)
Chloride: 104 mmol/L (ref 98–110)
Creat: 0.74 mg/dL (ref 0.50–0.99)
Globulin: 2.5 g/dL (calc) (ref 1.9–3.7)
Glucose, Bld: 81 mg/dL (ref 65–99)
Potassium: 5.3 mmol/L (ref 3.5–5.3)
Sodium: 140 mmol/L (ref 135–146)
Total Bilirubin: 0.7 mg/dL (ref 0.2–1.2)
Total Protein: 7.6 g/dL (ref 6.1–8.1)
eGFR: 104 mL/min/{1.73_m2} (ref >=60)

## 2021-12-31 LAB — LIPID PANEL
Cholesterol: 201 mg/dL — ABNORMAL HIGH (ref <200)
HDL: 82 mg/dL (ref >=50)
LDL Cholesterol (Calc): 105 mg/dL (calc) — ABNORMAL HIGH
Non-HDL Cholesterol (Calc): 119 mg/dL (calc) (ref <130)
Total CHOL/HDL Ratio: 2.5 (calc) (ref <5.0)
Triglycerides: 48 mg/dL (ref <150)

## 2021-12-31 LAB — CBC WITH DIFFERENTIAL/PLATELET
Absolute Monocytes: 461 cells/uL (ref 200–950)
Basophils Absolute: 42 cells/uL (ref 0–200)
Basophils Relative: 0.8 %
Eosinophils Absolute: 32 cells/uL (ref 15–500)
Eosinophils Relative: 0.6 %
HCT: 45.5 % — ABNORMAL HIGH (ref 35.0–45.0)
Hemoglobin: 15.4 g/dL (ref 11.7–15.5)
Lymphs Abs: 1866 cells/uL (ref 850–3900)
MCH: 32.5 pg (ref 27.0–33.0)
MCHC: 33.8 g/dL (ref 32.0–36.0)
MCV: 96 fL (ref 80.0–100.0)
MPV: 9.9 fL (ref 7.5–12.5)
Monocytes Relative: 8.7 %
Neutro Abs: 2899 cells/uL (ref 1500–7800)
Neutrophils Relative %: 54.7 %
Platelets: 298 10*3/uL (ref 140–400)
RBC: 4.74 10*6/uL (ref 3.80–5.10)
RDW: 12 % (ref 11.0–15.0)
Total Lymphocyte: 35.2 %
WBC: 5.3 10*3/uL (ref 3.8–10.8)

## 2021-12-31 LAB — TSH: TSH: 0.62 mIU/L

## 2022-01-04 ENCOUNTER — Encounter: Payer: Self-pay | Admitting: Internal Medicine

## 2022-01-04 ENCOUNTER — Ambulatory Visit (INDEPENDENT_AMBULATORY_CARE_PROVIDER_SITE_OTHER): Payer: 59 | Admitting: Internal Medicine

## 2022-01-04 VITALS — BP 120/80 | HR 88 | Temp 98.8°F | Ht 63.5 in | Wt 128.1 lb

## 2022-01-04 DIAGNOSIS — Z0000 Encounter for general adult medical examination without abnormal findings: Secondary | ICD-10-CM

## 2022-01-04 DIAGNOSIS — Z8659 Personal history of other mental and behavioral disorders: Secondary | ICD-10-CM

## 2022-01-04 DIAGNOSIS — I10 Essential (primary) hypertension: Secondary | ICD-10-CM | POA: Diagnosis not present

## 2022-01-04 DIAGNOSIS — Z87442 Personal history of urinary calculi: Secondary | ICD-10-CM

## 2022-01-04 LAB — POCT URINALYSIS DIPSTICK
Bilirubin, UA: NEGATIVE
Glucose, UA: NEGATIVE
Ketones, UA: NEGATIVE
Leukocytes, UA: NEGATIVE
Nitrite, UA: NEGATIVE
Protein, UA: NEGATIVE
Spec Grav, UA: 1.01 (ref 1.010–1.025)
Urobilinogen, UA: 0.2 E.U./dL
pH, UA: 6.5 (ref 5.0–8.0)

## 2022-01-04 NOTE — Progress Notes (Signed)
   Subjective:    Patient ID: Mia Nguyen, female    DOB: 03-May-1980, 41 y.o.   MRN: [id number]  HPI Very pleasant 41 year old Female seen for health maintenance exam and evaluation of medical issues.  Feeling well with no new complaints.  Recently been watching her blood pressure.  Was on attention deficit medication (Vyvanse) for a while and I was concerned that raised her blood pressure.  She is now off of that but feels a bit less organized than previously.  Was started on low-dose amlodipine 2.5 mg daily in June.  She has tolerated that well.  In August her blood pressure was 130/98.  She was seen at my suggestion by Dr. Harrell Gave in late August for evaluation.  Was diagnosed with primary hypertension.  Amlodipine was increased to 5 mg daily.  He is to return to see Dr. Harrell Gave in 2 months.  10-year ASCVD risk score was 0.4%  History of vitamin D deficiency.  Level was 32 in 09/28/20.  Level was 21 in December 2020  Lipid panel stable.  Total cholesterol 201, HDL 82, triglycerides 48 and LDL 105.  TSH is normal.  CBC is within normal limits and is his c-Met.  History of coccydynia treated by Dr. Hulan Saas in 2016.  History of bilateral nonobstructing nephrolithiasis on CT scan in the emergency department September 2019.  She saw urologist who felt she had passed the stone.  In Sep 29, 2019 she had an ESBL UTI and was basically asymptomatic.  Organism was sensitive to Macrobid and she was treated with this medication for 10 days.  Subsequently was placed on fosfomycin and has not had recurrence of ESBL UTI.  In June 2021 she had a kidney stone while vacationing in [place] mm stone affecting right ureter.  She was able to pass the stone and collected.  She subsequently requested evaluation at [facility] kidney stone clinic.  Stone was analyzed at [facility] indicated stone was comprised of  calcium phosphate, 10% calcium oxalate monohydrate and 10% calcium oxalate dihydrate.  She has  not had recurrence.  Social history: Married with 3 children, 2 sons and a daughter.  Husband works in Personal assistant.  She does not smoke.  Social alcohol consumption.  She is allergic to Sulfa- it causes a rash family history: History of kidney stones in father.  History of hypertension in mother.     Review of Systems no new complaints     Objective:   Physical Exam Vital signs reviewed.  Skin: Warm and dry.  No cervical adenopathy, thyromegaly or carotid bruits.  Chest clear to auscultation.  Cardiac exam: Regular rate and rhythm without ectopy.  Abdomen soft nondistended without hepatosplenomegaly masses or tenderness.  Breast and GYN exam deferred to GYN physician.  No lower extremity pitting edema.  Neuro intact without gross focal deficits.  Affect thought and judgment are normal.       Assessment & Plan:  Essential hypertension-currently on low-dose amlodipine 2.5 mg daily  History of attention deficit disorder-medication has been discontinued with history of elevated blood pressure  History of kidney stone-no recurrence in several years  Plan: I would like for her to see Dr. Harrell Gave for cardiac evaluation and recommendations regarding treatment for hypertension.  It is agreeable to this consultation.  Referral will be made.  Continue current medications until she sees Dr. Harrell Gave.

## 2022-01-21 ENCOUNTER — Other Ambulatory Visit: Payer: Self-pay | Admitting: Internal Medicine

## 2022-01-21 DIAGNOSIS — R030 Elevated blood-pressure reading, without diagnosis of hypertension: Secondary | ICD-10-CM

## 2022-01-23 ENCOUNTER — Encounter (HOSPITAL_BASED_OUTPATIENT_CLINIC_OR_DEPARTMENT_OTHER): Payer: Self-pay | Admitting: Cardiology

## 2022-02-17 ENCOUNTER — Other Ambulatory Visit: Payer: 59

## 2022-02-21 ENCOUNTER — Encounter: Payer: 59 | Admitting: Internal Medicine

## 2022-02-24 ENCOUNTER — Ambulatory Visit (HOSPITAL_BASED_OUTPATIENT_CLINIC_OR_DEPARTMENT_OTHER): Payer: 59 | Admitting: Cardiology

## 2022-02-24 ENCOUNTER — Encounter (HOSPITAL_BASED_OUTPATIENT_CLINIC_OR_DEPARTMENT_OTHER): Payer: Self-pay | Admitting: Cardiology

## 2022-02-24 VITALS — BP 140/92 | HR 73 | Ht 63.5 in | Wt 131.0 lb

## 2022-02-24 DIAGNOSIS — Z7189 Other specified counseling: Secondary | ICD-10-CM

## 2022-02-24 DIAGNOSIS — I10 Essential (primary) hypertension: Secondary | ICD-10-CM | POA: Diagnosis not present

## 2022-02-24 DIAGNOSIS — Z8249 Family history of ischemic heart disease and other diseases of the circulatory system: Secondary | ICD-10-CM

## 2022-02-24 MED ORDER — AMLODIPINE BESYLATE 10 MG PO TABS: 10.0000 mg | ORAL_TABLET | Freq: Every day | ORAL | 3 refills | Status: DC

## 2022-02-24 NOTE — Patient Instructions (Signed)
Medication Instructions:  INCREASE: Amlodipine to 10 mg daily  *If you need a refill on your cardiac medications before your next appointment, please call your pharmacy*   Lab Work: None ordered today   Testing/Procedures: None ordered today   Follow-Up: At [facility], you and your health needs are our priority.  As part of our continuing mission to provide you with exceptional heart care, we have created designated Provider Care Teams.  These Care Teams include your primary Cardiologist (physician) and Advanced Practice Providers (APPs -  Physician Assistants and Nurse Practitioners) who all work together to provide you with the care you need, when you need it.  We recommend signing up for the patient portal called "MyChart".  Sign up information is provided on this After Visit Summary.  MyChart is used to connect with patients for Virtual Visits (Telemedicine).  Patients are able to view lab/test results, encounter notes, upcoming appointments, etc.  Non-urgent messages can be sent to your provider as well.   To learn more about what you can do with MyChart, go to [email or web address].    Your next appointment:   3 month(s)  The format for your next appointment:   In Person  Provider:   Buford Dresser, MD

## 2022-02-24 NOTE — Progress Notes (Signed)
Cardiology Office Note:    Date:  02/24/2022   ID:  Marilynn Latino, DOB April 01, 1981, MRN [id number]  PCP:  Elby Showers, MD  Cardiologist:  Buford Dresser, MD  Referring MD: Elby Showers, MD   CC: follow up  History of Present Illness:    Mia Nguyen is a 41 y.o. female with a hx of pregnancy-induced hypertension, and nephrolithiasis, who is seen for follow up today. I initially met her 12/21/21 as a new consult at the request of Baxley, Cresenciano Lick, MD for the evaluation and management of elevated blood pressure.  Cardiovascular risk factors: Prior clinical ASCVD: None. Comorbid conditions: Hypertension -  Initially 2011 post-partum. She was started on amlodipine about 1 month ago. Her blood pressure was 155/110 and 160/104 at her recent Ob/Gyn visit. Typically she would deny white coat hypertension, although she may rush to her appointments. She denies any kidney disease. She has had kidney stones in the past. Metabolic syndrome/Obesity: She is concerned about rapid weight gain since she was started on amlodipine. She gained 5 lbs in a few weeks. Of note, she recently was taken off of an ADD medication. Earlier this year she reports her weight was closer to 115 lbs consistently. In clinic today she is 131 lb. Tobacco use history: Never. Family history: Her mother and father have hypertension (started antihypertensives at 34 and 41 yo respectively). There is a family history of strokes on her paternal side. Prior cardiac testing and/or incidental findings on other testing (ie coronary calcium): Exercise level: Walks her dog around the block for 10 minutes a few times a week. Sometimes participates in a yoga class weekly. Current diet: Quick breakfast, such as egg, toast, granola. Lunch may be heavy or light depending on the day. She considers her diet to be relatively healthy. Occasionally orders out. Usually has a couple cups of coffee daily.  Today: We increased her  amlodipine to 5 mg at her last visit. She has not had any side effects with this dose. Does not check BP at home.   Denies chest pain, shortness of breath at rest or with normal exertion. No PND, orthopnea, LE edema or unexpected weight gain. No syncope or palpitations.   Past Medical History:  Diagnosis Date   History of headache 07/31/2013   Kidney stone 12/26/2020   Postpartum care following vaginal delivery (07/29/13) 04/20/2011   Pregnancy induced hypertension    post partum with last pregnancy   Single umbilical artery, maternal, antepartum 04/11/2013   SVD (spontaneous vaginal delivery) 07/30/2013    No past surgical history on file.  Current Medications: Current Outpatient Medications on File Prior to Visit  Medication Sig   amLODipine (NORVASC) 5 MG tablet Take 1 tablet (5 mg total) by mouth daily.   No current facility-administered medications on file prior to visit.     Allergies:   Sulfa antibiotics   Social History   Tobacco Use   Smoking status: Never   Smokeless tobacco: Never  Substance Use Topics   Alcohol use: Yes   Drug use: No    Family History: family history includes Cancer in her cousin and maternal grandfather; Hypertension in her maternal grandmother and mother; Stroke in her paternal grandfather.  ROS:   Please see the history of present illness.  Additional pertinent ROS otherwise unremarkable.   EKGs/Labs/Other Studies Reviewed:    The following studies were reviewed today:  No prior cardiovascular studies available.   EKG:  EKG is personally reviewed.  12/21/2021:  NSR at 63 bpm  Recent Labs: 12/30/2021: ALT 15; BUN 15; Creat 0.74; Hemoglobin 15.4; Platelets 298; Potassium 5.3; Sodium 140; TSH 0.62   Recent Lipid Panel    Component Value Date/Time   CHOL 201 (H) 12/30/2021 0919   TRIG 48 12/30/2021 0919   HDL 82 12/30/2021 0919   CHOLHDL 2.5 12/30/2021 0919   LDLCALC 105 (H) 12/30/2021 0919    Physical Exam:    VS:  BP (!)  140/92   Pulse 73   Ht 5' 3.5" (1.613 m)   Wt 131 lb (59.4 kg)   BMI 22.84 kg/m     Wt Readings from Last 3 Encounters:  02/24/22 131 lb (59.4 kg)  01/04/22 128 lb 1.9 oz (58.1 kg)  12/21/21 131 lb 8 oz (59.6 kg)    GEN: Well nourished, well developed in no acute distress HEENT: Normal, moist mucous membranes NECK: No JVD CARDIAC: regular rhythm, normal S1 and S2, no rubs or gallops. No murmur. VASCULAR: Radial and DP pulses 2+ bilaterally. No carotid bruits RESPIRATORY:  Clear to auscultation without rales, wheezing or rhonchi  ABDOMEN: Soft, non-tender, non-distended MUSCULOSKELETAL:  Ambulates independently SKIN: Warm and dry, no edema NEUROLOGIC:  Alert and oriented x 3. No focal neuro deficits noted. PSYCHIATRIC:  Normal affect    ASSESSMENT:    1. Primary hypertension   2. Family history of essential hypertension   3. Cardiac risk counseling   4. Counseling on health promotion and disease prevention     PLAN:    Hypertension -diastolic number significantly above goal, systolic at/near goal -currently on amlodipine 5 mg, will increase to 10 mg daily dose.  -discussed recommendations for salt avoidance, exercise -discussed how to check home BP  Cardiac risk counseling and prevention recommendations: -recommend heart healthy/Mediterranean diet, with whole grains, fruits, vegetable, fish, lean meats, nuts, and olive oil. Limit salt. -recommend moderate walking, 3-5 times/week for 30-50 minutes each session. Aim for at least 150 minutes.week. Goal should be pace of 3 miles/hours, or walking 1.5 miles in 30 minutes -recommend avoidance of tobacco products. Avoid excess alcohol. -ASCVD risk score: The 10-year ASCVD risk score (Arnett DK, et al., 2019) is: 0.5%   Values used to calculate the score:     Age: 17 years     Sex: Female     Is Non-Hispanic African American: No     Diabetic: No     Tobacco smoker: No     Systolic Blood Pressure: 283 mmHg     Is BP  treated: Yes     HDL Cholesterol: 82 mg/dL     Total Cholesterol: 201 mg/dL    Plan for follow up: 3 months or sooner as needed.  Buford Dresser, MD, PhD, [facility]    Medication Adjustments/Labs and Tests Ordered: Current medicines are reviewed at length with the patient today.  Concerns regarding medicines are outlined above.   No orders of the defined types were placed in this encounter.  Meds ordered this encounter  Medications   amLODipine (NORVASC) 10 MG tablet    Sig: Take 1 tablet (10 mg total) by mouth daily.    Dispense:  90 tablet    Refill:  3    Patient will call when ready for refill   Patient Instructions  Medication Instructions:  INCREASE: Amlodipine to 10 mg daily  *If you need a refill on your cardiac medications before your next appointment, please call your pharmacy*   Lab  Work: None ordered today   Testing/Procedures: None ordered today   Follow-Up: At [facility], you and your health needs are our priority.  As part of our continuing mission to provide you with exceptional heart care, we have created designated Provider Care Teams.  These Care Teams include your primary Cardiologist (physician) and Advanced Practice Providers (APPs -  Physician Assistants and Nurse Practitioners) who all work together to provide you with the care you need, when you need it.  We recommend signing up for the patient portal called "MyChart".  Sign up information is provided on this After Visit Summary.  MyChart is used to connect with patients for Virtual Visits (Telemedicine).  Patients are able to view lab/test results, encounter notes, upcoming appointments, etc.  Non-urgent messages can be sent to your provider as well.   To learn more about what you can do with MyChart, go to [email or web address].    Your next appointment:   3 month(s)  The format for your next appointment:   In Person  Provider:   Buford Dresser, MD             Signed, Buford Dresser, MD PhD 02/24/2022     [place]

## 2022-03-28 ENCOUNTER — Telehealth (INDEPENDENT_AMBULATORY_CARE_PROVIDER_SITE_OTHER): Payer: 59 | Admitting: Internal Medicine

## 2022-03-28 ENCOUNTER — Encounter: Payer: Self-pay | Admitting: Internal Medicine

## 2022-03-28 VITALS — BP 135/91 | Temp 97.7°F | Ht 63.0 in | Wt 130.0 lb

## 2022-03-28 DIAGNOSIS — I10 Essential (primary) hypertension: Secondary | ICD-10-CM | POA: Diagnosis not present

## 2022-03-28 DIAGNOSIS — U071 COVID-19: Secondary | ICD-10-CM

## 2022-03-28 MED ORDER — NIRMATRELVIR/RITONAVIR (PAXLOVID)TABLET: 3.0000 | ORAL_TABLET | Freq: Two times a day (BID) | ORAL | 0 refills | Status: AC

## 2022-03-28 NOTE — Telephone Encounter (Signed)
Patient scheduled.

## 2022-03-28 NOTE — Progress Notes (Signed)
   Subjective:    Patient ID: Mia Nguyen, female    DOB: [birth year], 41 y.o.   MRN: [id number]  HPI 41 year old Female seen today by interactive audio and video telecommunications.  She is identified as Mia Nguyen, a patient in this practice.  She is at her home and I am at my office.  She is agreeable to visit in this format today.  Patient has a history of essential hypertension and is being maintained on Norvasc 10 mg daily per Dr. Harrell Gave.  Patient tested positive for COVID-19 last evening and is being evaluated today.  She would like to be considered for Paxlovid if appropriate.  Patient went to a Thanksgiving dinner with family.  No one at the time was acutely ill.  Immediate family not ill either.    Review of Systems she has some generalized malaise and fatigue.     Objective:   Physical Exam  She is seen virtually.  She is not tachypneic.  Reports blood pressure to be 135/91, temperature 97.7 degrees and weight 130 pounds.  She has no audible wheezing.      Assessment & Plan:   Acute COVID-19 virus infection  Plan: She cannot have COVID booster for at least a couple of months.  She is interested in Paxlovid.  I think that is appropriate.  I have prescribed regular strength Paxlovid for her.  She has a normal GFR.  She will take 3 tablets by mouth twice daily for 5 days.  She needs to quarantine at home for 5 days.  Rest and stay well-hydrated.  Call if she develops shortness of breath.  Time spent with chart review, interviewing patient, medical decision making and E scribing medication is 20 minutes

## 2022-03-28 NOTE — Patient Instructions (Signed)
We are sorry that you are not feeling well today.  A prescription for Paxlovid has been sent to your pharmacy.  Please take as directed.  Monitor pulse oximetry.  Notify us if you are short of breath.  Stay well-hydrated.  May take Tylenol for fever.  Quarantine at home for 5 days.

## 2022-05-18 NOTE — Progress Notes (Signed)
Cardiology Office Note:    Date:  05/19/2022   ID:  Mia Nguyen, DOB Feb 05, 1981, MRN [id number]  PCP:  Elby Showers, MD  Cardiologist:  Buford Dresser, MD  Referring MD: Elby Showers, MD   CC: Follow-up for hypertension  History of Present Illness:    Mia Nguyen is a 42 y.o. female with a hx of pregnancy-induced hypertension, and nephrolithiasis, who is seen for follow-up today. I initially met her 12/21/2021 as a new consult at the request of Baxley, Cresenciano Lick, MD for the evaluation and management of elevated blood pressure.  Cardiovascular risk factors: Prior clinical ASCVD: None. Comorbid conditions: Hypertension -  Initially 2011 post-partum. She was started on amlodipine about 1 month ago. Her blood pressure was 155/110 and 160/104 at her recent Ob/Gyn visit. Typically she would deny white coat hypertension, although she may rush to her appointments. She denies any kidney disease. She has had kidney stones in the past. Metabolic syndrome/Obesity: She is concerned about rapid weight gain since she was started on amlodipine. She gained 5 lbs in a few weeks. Of note, she recently was taken off of an ADD medication. Earlier this year she reports her weight was closer to 115 lbs consistently. In clinic today she is 131 lb. Tobacco use history: Never. Family history: Her mother and father have hypertension (started antihypertensives at 53 and 42 yo respectively). There is a family history of strokes on her paternal side. Prior cardiac testing and/or incidental findings on other testing (ie coronary calcium): Exercise level: Walks her dog around the block for 10 minutes a few times a week. Sometimes participates in a yoga class weekly. Current diet: Quick breakfast, such as egg, toast, granola. Lunch may be heavy or light depending on the day. She considers her diet to be relatively healthy. Occasionally orders out. Usually has a couple cups of coffee daily.  At her  initial visit we had increased her amlodipine to 5 mg. She tolerated this well. In follow-up 01/2022 her blood pressure was 140/92; she was not monitoring blood pressures at home. We again increased her amlodipine to 10 mg.  She developed an acute COVID infection 03/2022 and was given Paxlovid.  Today, she states she is feeling well. Her blood pressure is controlled. In clinic it is 124/84.   Lately she has noticed some LE swelling which she attributes to the increased dose of amlodipine (10 mg). This is often worse after eating dinner. After going on walks she is also sometimes aware of mild swelling in her hands.  She denies any palpitations, chest pain, shortness of breath. No lightheadedness, headaches, syncope, orthopnea, or PND.   Past Medical History:  Diagnosis Date   History of headache 07/31/2013   Kidney stone 12/26/2020   Postpartum care following vaginal delivery (07/29/13) 04/20/2011   Pregnancy induced hypertension    post partum with last pregnancy   Single umbilical artery, maternal, antepartum 04/11/2013   SVD (spontaneous vaginal delivery) 07/30/2013    No past surgical history on file.  Current Medications: Current Outpatient Medications on File Prior to Visit  Medication Sig   amLODipine (NORVASC) 10 MG tablet Take 1 tablet (10 mg total) by mouth daily.   No current facility-administered medications on file prior to visit.     Allergies:   Sulfa antibiotics   Social History   Tobacco Use   Smoking status: Never   Smokeless tobacco: Never  Substance Use Topics   Alcohol use: Yes   Drug  use: No    Family History: family history includes Cancer in her cousin and maternal grandfather; Hypertension in her maternal grandmother and mother; Stroke in her paternal grandfather.  ROS:   Please see the history of present illness. (+) Peripheral edema All other systems are reviewed and negative.    EKGs/Labs/Other Studies Reviewed:    The following studies  were reviewed today:  No prior cardiovascular studies available.   EKG:  EKG is personally reviewed.   05/18/2022:  EKG was not ordered. 12/21/2021:  NSR at 63 bpm  Recent Labs: 12/30/2021: ALT 15; BUN 15; Creat 0.74; Hemoglobin 15.4; Platelets 298; Potassium 5.3; Sodium 140; TSH 0.62   Recent Lipid Panel    Component Value Date/Time   CHOL 201 (H) 12/30/2021 0919   TRIG 48 12/30/2021 0919   HDL 82 12/30/2021 0919   CHOLHDL 2.5 12/30/2021 0919   LDLCALC 105 (H) 12/30/2021 0919    Physical Exam:    VS:  BP 124/84 (BP Location: Left Arm, Patient Position: Sitting, Cuff Size: Normal)   Pulse 76   Ht 5' 3"$  (1.6 m)   Wt 135 lb 12.8 oz (61.6 kg)   SpO2 98%   BMI 24.06 kg/m     Wt Readings from Last 3 Encounters:  05/19/22 135 lb 12.8 oz (61.6 kg)  03/28/22 130 lb (59 kg)  02/24/22 131 lb (59.4 kg)    GEN: Well nourished, well developed in no acute distress HEENT: Normal, moist mucous membranes NECK: No JVD CARDIAC: regular rhythm, normal S1 and S2, no rubs or gallops. No murmur. VASCULAR: Radial and DP pulses 2+ bilaterally. No carotid bruits RESPIRATORY:  Clear to auscultation without rales, wheezing or rhonchi  ABDOMEN: Soft, non-tender, non-distended MUSCULOSKELETAL:  Ambulates independently SKIN: Warm and dry, no edema NEUROLOGIC:  Alert and oriented x 3. No focal neuro deficits noted. PSYCHIATRIC:  Normal affect    ASSESSMENT:    1. Primary hypertension   2. Family history of essential hypertension   3. Cardiac risk counseling   4. Counseling on health promotion and disease prevention     PLAN:    Hypertension, with family history -now at goal on 10 mg amlodipine, though does have intermittent peripheral edema -discussed options, including alternative medication options. For now, she feels she can manage the edema. If this worsens, she will contact me  Cardiac risk counseling and prevention recommendations: -recommend heart healthy/Mediterranean diet, with  whole grains, fruits, vegetable, fish, lean meats, nuts, and olive oil. Limit salt. -recommend moderate walking, 3-5 times/week for 30-50 minutes each session. Aim for at least 150 minutes.week. Goal should be pace of 3 miles/hours, or walking 1.5 miles in 30 minutes -recommend avoidance of tobacco products. Avoid excess alcohol. -ASCVD risk score: The 10-year ASCVD risk score (Arnett DK, et al., 2019) is: 0.4%   Values used to calculate the score:     Age: 57 years     Sex: Female     Is Non-Hispanic African American: No     Diabetic: No     Tobacco smoker: No     Systolic Blood Pressure: [id number] mmHg     Is BP treated: Yes     HDL Cholesterol: 82 mg/dL     Total Cholesterol: 201 mg/dL    Plan for follow up: 1 year or sooner as needed.  Buford Dresser, MD, PhD, [facility]    Medication Adjustments/Labs and Tests Ordered: Current medicines are reviewed at length with the patient today.  Concerns regarding medicines are outlined above.   No orders of the defined types were placed in this encounter.  No orders of the defined types were placed in this encounter.  Patient Instructions  Medication Instructions:  Your physician recommends that you continue on your current medications as directed. Please refer to the Current Medication list given to you today.  *If you need a refill on your cardiac medications before your next appointment, please call your pharmacy*  Lab Work: NONE  Testing/Procedures: NONE  Follow-Up: At [facility], you and your health needs are our priority.  As part of our continuing mission to provide you with exceptional heart care, we have created designated Provider Care Teams.  These Care Teams include your primary Cardiologist (physician) and Advanced Practice Providers (APPs -  Physician Assistants and Nurse Practitioners) who all work together to provide you with the care you need, when you need it.  We recommend  signing up for the patient portal called "MyChart".  Sign up information is provided on this After Visit Summary.  MyChart is used to connect with patients for Virtual Visits (Telemedicine).  Patients are able to view lab/test results, encounter notes, upcoming appointments, etc.  Non-urgent messages can be sent to your provider as well.   To learn more about what you can do with MyChart, go to [email or web address].    Your next appointment:   12 month(s)  The format for your next appointment:   In Person  Provider:   Buford Dresser, MD       [facility],acting as a scribe for Buford Dresser, MD.,have documented all relevant documentation on the behalf of Buford Dresser, MD,as directed by  Buford Dresser, MD while in the presence of Buford Dresser, MD.  I, Buford Dresser, MD, have reviewed all documentation for this visit. The documentation on 05/19/22 for the exam, diagnosis, procedures, and orders are all accurate and complete.   Signed, Buford Dresser, MD PhD 05/19/2022     [place]

## 2022-05-19 ENCOUNTER — Ambulatory Visit (HOSPITAL_BASED_OUTPATIENT_CLINIC_OR_DEPARTMENT_OTHER): Payer: 59 | Admitting: Cardiology

## 2022-05-19 ENCOUNTER — Encounter (HOSPITAL_BASED_OUTPATIENT_CLINIC_OR_DEPARTMENT_OTHER): Payer: Self-pay | Admitting: Cardiology

## 2022-05-19 VITALS — BP 124/84 | HR 76 | Ht 63.0 in | Wt 135.8 lb

## 2022-05-19 DIAGNOSIS — I10 Essential (primary) hypertension: Secondary | ICD-10-CM | POA: Diagnosis not present

## 2022-05-19 DIAGNOSIS — Z7189 Other specified counseling: Secondary | ICD-10-CM | POA: Diagnosis not present

## 2022-05-19 DIAGNOSIS — Z8249 Family history of ischemic heart disease and other diseases of the circulatory system: Secondary | ICD-10-CM | POA: Diagnosis not present

## 2022-05-19 NOTE — Patient Instructions (Signed)
Medication Instructions:  Your physician recommends that you continue on your current medications as directed. Please refer to the Current Medication list given to you today.   *If you need a refill on your cardiac medications before your next appointment, please call your pharmacy*  Lab Work: NONE  Testing/Procedures: NONE  Follow-Up: At [facility], you and your health needs are our priority.  As part of our continuing mission to provide you with exceptional heart care, we have created designated Provider Care Teams.  These Care Teams include your primary Cardiologist (physician) and Advanced Practice Providers (APPs -  Physician Assistants and Nurse Practitioners) who all work together to provide you with the care you need, when you need it.  We recommend signing up for the patient portal called "MyChart".  Sign up information is provided on this After Visit Summary.  MyChart is used to connect with patients for Virtual Visits (Telemedicine).  Patients are able to view lab/test results, encounter notes, upcoming appointments, etc.  Non-urgent messages can be sent to your provider as well.   To learn more about what you can do with MyChart, go to [email or web address].    Your next appointment:   12 month(s)  The format for your next appointment:   In Person  Provider:   Bridgette Christopher, MD        

## 2022-05-23 ENCOUNTER — Encounter (HOSPITAL_BASED_OUTPATIENT_CLINIC_OR_DEPARTMENT_OTHER): Payer: Self-pay | Admitting: Cardiology

## 2022-06-06 ENCOUNTER — Telehealth: Payer: Self-pay

## 2022-06-06 ENCOUNTER — Encounter: Payer: Self-pay | Admitting: Internal Medicine

## 2022-06-06 ENCOUNTER — Ambulatory Visit: Payer: 59 | Admitting: Internal Medicine

## 2022-06-06 VITALS — BP 120/84 | HR 86 | Temp 98.5°F

## 2022-06-06 DIAGNOSIS — R059 Cough, unspecified: Secondary | ICD-10-CM

## 2022-06-06 DIAGNOSIS — H6693 Otitis media, unspecified, bilateral: Secondary | ICD-10-CM | POA: Diagnosis not present

## 2022-06-06 LAB — POCT RAPID STREP A (OFFICE): Rapid Strep A Screen: NEGATIVE

## 2022-06-06 MED ORDER — BENZONATATE 100 MG PO CAPS: 100.0000 mg | ORAL_CAPSULE | Freq: Three times a day (TID) | ORAL | 0 refills | Status: AC | PRN

## 2022-06-06 MED ORDER — AZITHROMYCIN 250 MG PO TABS: ORAL_TABLET | ORAL | 0 refills | Status: AC

## 2022-06-06 NOTE — Progress Notes (Signed)
Patient Care Team: Elby Showers, MD as PCP - General (Internal Medicine) Buford Dresser, MD as PCP - Cardiology (Cardiology)  Visit Date: 06/06/22  Subjective:    Patient ID: Mia Nguyen , Female   DOB: 09/20/1980, 42 y.o.    MRN: [id number]   41 y.o. Female presents today for cough and congestion. Patient has a past medical history of headaches, kidney stones, hypertension kidney disease.  She reports experiencing cough with yellow/green sputum, sore throat, head congestion and headaches starting on 05/29/22, more severe in past few days. Denies fever, chills, aching. Reports that ear pain is more severe than sore throat.   Past Medical History:  Diagnosis Date   History of headache 07/31/2013   Kidney stone 12/26/2020   Postpartum care following vaginal delivery (07/29/13) 04/20/2011   Pregnancy induced hypertension    post partum with last pregnancy   Single umbilical artery, maternal, antepartum 04/11/2013   SVD (spontaneous vaginal delivery) 07/30/2013     Family History  Problem Relation Age of Onset   Hypertension Mother    Hypertension Maternal Grandmother    Cancer Maternal Grandfather        prostate   Cancer Cousin        breast   Stroke Paternal Grandfather     Social History   Social History Narrative   Not on file      Review of Systems  Constitutional:  Negative for chills, fever and malaise/fatigue.  HENT:  Positive for congestion (Head).   Eyes:  Negative for blurred vision.  Respiratory:  Positive for cough and sputum production (Yellow, green). Negative for shortness of breath.   Cardiovascular:  Negative for chest pain, palpitations and leg swelling.  Gastrointestinal:  Negative for vomiting.  Musculoskeletal:  Negative for back pain.  Skin:  Negative for rash.  Neurological:  Positive for headaches. Negative for loss of consciousness.        Objective:   Vitals: BP 120/84   Pulse 86   Temp 98.5 F (36.9 C) (Tympanic)    SpO2 98%    Physical Exam Constitutional:      General: She is not in acute distress.    Appearance: Normal appearance. She is not ill-appearing.  HENT:     Head: Normocephalic and atraumatic.     Left Ear: Tympanic membrane is erythematous.     Ears:     Comments: Left TM full and red. Right TM slightly full, pink with splayed light reflex.     Mouth/Throat:     Mouth: Mucous membranes are moist.     Pharynx: Posterior oropharyngeal erythema present. No oropharyngeal exudate.     Comments: Throat is injected. Pulmonary:     Effort: Pulmonary effort is normal.  Musculoskeletal:     Cervical back: Neck supple.  Skin:    General: Skin is warm and dry.  Neurological:     Mental Status: She is alert and oriented to person, place, and time.  Psychiatric:        Mood and Affect: Mood normal.        Behavior: Behavior normal.        Thought Content: Thought content normal.        Judgment: Judgment normal.       Results:   Studies obtained and personally reviewed by me:    Labs:       Component Value Date/Time   NA 140 12/30/2021 0919   K 5.3 12/30/2021 0919  CL 104 12/30/2021 0919   CO2 29 12/30/2021 0919   GLUCOSE 81 12/30/2021 0919   BUN 15 12/30/2021 0919   CREATININE 0.74 12/30/2021 0919   CALCIUM 10.0 12/30/2021 0919   PROT 7.6 12/30/2021 0919   ALBUMIN 4.1 01/23/2018 1911   AST 19 12/30/2021 0919   ALT 15 12/30/2021 0919   ALKPHOS 37 (L) 01/23/2018 1911   BILITOT 0.7 12/30/2021 0919   GFRNONAA 114 08/31/2020 0933   GFRAA 132 08/31/2020 0933     Lab Results  Component Value Date   WBC 5.3 12/30/2021   HGB 15.4 12/30/2021   HCT 45.5 (H) 12/30/2021   MCV 96.0 12/30/2021   PLT 298 12/30/2021    Lab Results  Component Value Date   CHOL 201 (H) 12/30/2021   HDL 82 12/30/2021   LDLCALC 105 (H) 12/30/2021   TRIG 48 12/30/2021   CHOLHDL 2.5 12/30/2021    No results found for: "HGBA1C"   Lab Results  Component Value Date   TSH 0.62  12/30/2021      Assessment & Plan:   Bilateral Otitis Media, Non-Exudative Pharyngitis: Prescribed Azithromycin 250 2 tabs day 1 and one tablet days 2-5. Tessalon Perles 100 mg 3 times daily as needed for cough.    I,Alexander Ruley,acting as a Education administrator for Elby Showers, MD.,have documented all relevant documentation on the behalf of Elby Showers, MD,as directed by  Elby Showers, MD while in the presence of Elby Showers, MD.   I, Elby Showers, MD, have reviewed all documentation for this visit. The documentation on 06/07/22 for the exam, diagnosis, procedures, and orders are all accurate and complete.

## 2022-06-06 NOTE — Telephone Encounter (Signed)
Patient called stating she is having some chest congestion, cough and headaches.  Covid test was negative.  Scheduled for this afternoon

## 2022-06-10 NOTE — Patient Instructions (Signed)
Take Zithromax Z-PAK 2 tabs day 1 followed by 1 tab days 2 through 5.  Tessalon Perles 100 mg 3 times daily as needed for cough.

## 2022-06-19 ENCOUNTER — Encounter (HOSPITAL_BASED_OUTPATIENT_CLINIC_OR_DEPARTMENT_OTHER): Payer: Self-pay | Admitting: Cardiology

## 2022-06-30 NOTE — Progress Notes (Signed)
Patient Care Team: Elby Showers, MD as PCP - General (Internal Medicine) Buford Dresser, MD as PCP - Cardiology (Cardiology)  Visit Date: 07/07/22  Subjective:    Patient ID: Mia Nguyen , Female   DOB: 02-28-1981, 42 y.o.    MRN: [id number]   41 y.o. Female presents today for a 6 month follow-up for blood pressure. Patient has a past medical history of headaches, kidney stone, pregnancy induced hypertension.  Seen by Dr. Harrell Gave, Cardiologist in October 2023 for evaluation of elevated blood pressure readings.  Was placed on amlodipine 10 mg daily which has worked well in controlling blood pressure.  Blood pressure normal today at 124/82.  However, had a warm weather vacation recently and was having issues with swelling in her feet/ankles.  This is likely due to dependent edema from amlodipine 10 mg daily.  Will be prudent to decrease to 5 mg daily.  She has been taking 5 mg for few days now her blood pressure is stable.   Past Medical History:  Diagnosis Date   History of headache 07/31/2013   Kidney stone 12/26/2020   Postpartum care following vaginal delivery (07/29/13) 04/20/2011   Pregnancy induced hypertension    post partum with last pregnancy   Single umbilical artery, maternal, antepartum 04/11/2013   SVD (spontaneous vaginal delivery) 07/30/2013     Family History  Problem Relation Age of Onset   Hypertension Mother    Hypertension Maternal Grandmother    Cancer Maternal Grandfather        prostate   Cancer Cousin        breast   Stroke Paternal Grandfather          Review of Systems  Constitutional:  Negative for fever and malaise/fatigue.  HENT:  Negative for congestion.   Eyes:  Negative for blurred vision.  Respiratory:  Negative for cough and shortness of breath.   Cardiovascular:  Positive for leg swelling (Feet/ankles). Negative for chest pain and palpitations.  Gastrointestinal:  Negative for vomiting.  Musculoskeletal:  Negative  for back pain.  Skin:  Negative for rash.  Neurological:  Negative for loss of consciousness and headaches.        Objective:   Vitals: BP 124/82   Pulse 89   Temp 97.9 F (36.6 C) (Tympanic)   Ht '5\' 4"'$  (1.626 m)   Wt 135 lb 6.4 oz (61.4 kg)   SpO2 98%   BMI 23.24 kg/m    Physical Exam Vitals and nursing note reviewed.  Constitutional:      General: She is not in acute distress.    Appearance: Normal appearance. She is not toxic-appearing.  HENT:     Head: Normocephalic and atraumatic.     Right Ear: Hearing, tympanic membrane, ear canal and external ear normal.     Left Ear: Hearing, tympanic membrane, ear canal and external ear normal.  Cardiovascular:     Rate and Rhythm: Normal rate and regular rhythm. No extrasystoles are present.    Heart sounds: Normal heart sounds. No murmur heard.    No gallop.  Pulmonary:     Effort: Pulmonary effort is normal. No respiratory distress.     Breath sounds: Normal breath sounds. No wheezing or rales.  Musculoskeletal:     Right lower leg: No edema.     Left lower leg: No edema.  Skin:    General: Skin is warm and dry.  Neurological:     Mental Status: She is alert and oriented  to person, place, and time. Mental status is at baseline.  Psychiatric:        Mood and Affect: Mood normal.        Behavior: Behavior normal.        Thought Content: Thought content normal.        Judgment: Judgment normal.       Results:   Studies obtained and personally reviewed by me:   Labs:       Component Value Date/Time   NA 140 12/30/2021 0919   K 5.3 12/30/2021 0919   CL 104 12/30/2021 0919   CO2 29 12/30/2021 0919   GLUCOSE 81 12/30/2021 0919   BUN 15 12/30/2021 0919   CREATININE 0.74 12/30/2021 0919   CALCIUM 10.0 12/30/2021 0919   PROT 7.6 12/30/2021 0919   ALBUMIN 4.1 01/23/2018 1911   AST 19 12/30/2021 0919   ALT 15 12/30/2021 0919   ALKPHOS 37 (L) 01/23/2018 1911   BILITOT 0.7 12/30/2021 0919   GFRNONAA 114  08/31/2020 0933   GFRAA 132 08/31/2020 0933     Lab Results  Component Value Date   WBC 5.3 12/30/2021   HGB 15.4 12/30/2021   HCT 45.5 (H) 12/30/2021   MCV 96.0 12/30/2021   PLT 298 12/30/2021    Lab Results  Component Value Date   CHOL 201 (H) 12/30/2021   HDL 82 12/30/2021   LDLCALC 105 (H) 12/30/2021   TRIG 48 12/30/2021   CHOLHDL 2.5 12/30/2021    No results found for: "HGBA1C"   Lab Results  Component Value Date   TSH 0.62 12/30/2021      Assessment & Plan:   Essential hypertension: Continue with Amlodipine 5 mg daily. Blood pressure normal today at 124/82. Contact us for recurrence of feet/ankle swelling.  I believe combination of warm weather and long plane ride led to dependent edema on amlodipine 10 mg daily.  We will try 5 mg daily instead.  If  blood pressure is persistently elevated at  130/90 or higher, she will need reevaluation before her health maintenance exam in September.  History of pregnancy-induced hypertension  She has health maintenance exam scheduled here for September 2024.  She may contact me at any time if she has questions or concerns.  I,Alexander Ruley,acting as a Education administrator for Elby Showers, MD.,have documented all relevant documentation on the behalf of Elby Showers, MD,as directed by  Elby Showers, MD while in the presence of Elby Showers, MD.   I, Elby Showers, MD, have reviewed all documentation for this visit. The documentation on 07/08/22 for the exam, diagnosis, procedures, and orders are all accurate and complete.

## 2022-07-07 ENCOUNTER — Ambulatory Visit: Payer: 59 | Admitting: Internal Medicine

## 2022-07-07 ENCOUNTER — Encounter: Payer: Self-pay | Admitting: Internal Medicine

## 2022-07-07 VITALS — BP 124/82 | HR 89 | Temp 97.9°F | Ht 64.0 in | Wt 135.4 lb

## 2022-07-07 DIAGNOSIS — I10 Essential (primary) hypertension: Secondary | ICD-10-CM

## 2022-07-07 DIAGNOSIS — R609 Edema, unspecified: Secondary | ICD-10-CM

## 2022-07-08 ENCOUNTER — Encounter: Payer: Self-pay | Admitting: Internal Medicine

## 2022-07-08 NOTE — Patient Instructions (Addendum)
I think amlodipine 10 mg daily has caused dependent edema.  This has been reported as an adverse effect.  This may occur in weather or long plane rides.  I think it is okay to try taking amlodipine 5 mg daily and monitor your blood pressure at home.  Please let me know if readings are not within normal limits.  You may follow-up here in September for health maintenance exam but please call if there are any concerns before that time.

## 2022-08-19 ENCOUNTER — Telehealth: Payer: Self-pay | Admitting: Cardiology

## 2022-08-19 NOTE — Telephone Encounter (Signed)
Pt c/o medication issue:  1. Name of Medication:  amLODipine (NORVASC) 10 MG tablet  2. How are you currently taking this medication (dosage and times per day)?   3. Are you having a reaction (difficulty breathing--STAT)?   4. What is your medication issue?   Patient states she initially started on 5 MG and Dr. Cristal Deer increased her to 10 MG. During a warm weather trip in February she experienced severe swelling at night. She states her PCP is recommending that she does back down to 5 MG now that the weather is getting warmer and she would like to know if Dr. Cristal Deer agrees.

## 2022-08-19 NOTE — Telephone Encounter (Signed)
Left detailed message, ok per DPR that Dr Cristal Deer was out of office today and hospital next week.  Monitor blood pressure and call the office if not consistently below 130/80 on the Amlodipine 5 mg daily and will forward to Dr Cristal Deer for review

## 2022-08-25 NOTE — Telephone Encounter (Signed)
Amlodipine can cause swelling at  dose. Okay to return to  daily dose. Tips for reducing swelling included below.   Please ask her to check BP once per day at least an hour after medications and keep a log. Report back with BP in 2 weeks. If BP not at goal <130/80 will have to adjust medications.   To prevent or reduce lower extremity swelling: Eat a low salt diet. Salt makes the body hold onto extra fluid which causes swelling. Sit with legs elevated. For example, in the recliner or on an ottoman.  Wear knee-high compression stockings during the daytime. Ones labeled 15-20 mmHg provide good compression.   Alver Sorrow, NP

## 2022-08-26 NOTE — Telephone Encounter (Signed)
Returned call to patient, Left message for patient to call back.     "Amlodipine can cause swelling at 10mg  dose. Okay to return to 5mg  daily dose. Tips for reducing swelling included below.    Please ask her to check BP once per day at least an hour after medications and keep a log. Report back with BP in 2 weeks. If BP not at goal <130/80 will have to adjust medications.    To prevent or reduce lower extremity swelling:  Eat a low salt diet. Salt makes the body hold onto extra fluid which causes swelling.  Sit with legs elevated. For example, in the recliner or on an ottoman.   Wear knee-high compression stockings during the daytime. Ones labeled 15-20 mmHg provide good compression.     Alver Sorrow, NP"

## 2022-08-29 MED ORDER — AMLODIPINE BESYLATE 5 MG PO TABS: 5.0000 mg | ORAL_TABLET | Freq: Every day | ORAL | 6 refills | Status: AC

## 2022-08-29 NOTE — Addendum Note (Signed)
Addended by: Marlene Lard on: 08/29/2022 04:52 PM   Modules accepted: Orders

## 2022-08-29 NOTE — Telephone Encounter (Signed)
Left message for patient to call back and recommendations sent via mychart        "Amlodipine can cause swelling at 10mg  dose. Okay to return to 5mg  daily dose. Tips for reducing swelling included below.    Please ask her to check BP once per day at least an hour after medications and keep a log. Report back with BP in 2 weeks. If BP not at goal <130/80 will have to adjust medications.    To prevent or reduce lower extremity swelling:            Eat a low salt diet. Salt makes the body hold onto extra fluid which causes swelling.            Sit with legs elevated. For example, in the recliner or on an ottoman.             Wear knee-high compression stockings during the daytime. Ones labeled 15-20 mmHg provide good compression.     Mia Sorrow, NP"

## 2022-12-29 ENCOUNTER — Encounter: Payer: Self-pay | Admitting: Family Medicine

## 2022-12-29 ENCOUNTER — Other Ambulatory Visit: Payer: Self-pay | Admitting: Family Medicine

## 2022-12-29 DIAGNOSIS — Z1231 Encounter for screening mammogram for malignant neoplasm of breast: Secondary | ICD-10-CM

## 2022-12-29 DIAGNOSIS — E049 Nontoxic goiter, unspecified: Secondary | ICD-10-CM

## 2022-12-29 LAB — HM MAMMOGRAPHY

## 2023-01-04 ENCOUNTER — Ambulatory Visit: Admission: RE | Admit: 2023-01-04 | Payer: 59 | Source: Ambulatory Visit

## 2023-01-04 DIAGNOSIS — E049 Nontoxic goiter, unspecified: Secondary | ICD-10-CM

## 2023-01-06 ENCOUNTER — Telehealth: Payer: Self-pay | Admitting: Internal Medicine

## 2023-01-06 NOTE — Telephone Encounter (Signed)
LVM to CB and reschedule CPE, have an opening on 01/13/2023 at 3:00 or one day at 2:00.  Last CPE was 01/04/2022

## 2023-01-12 ENCOUNTER — Other Ambulatory Visit: Payer: 59

## 2023-01-19 ENCOUNTER — Encounter: Payer: 59 | Admitting: Internal Medicine

## 2023-02-21 ENCOUNTER — Other Ambulatory Visit: Payer: Self-pay | Admitting: Obstetrics

## 2023-02-21 DIAGNOSIS — N6452 Nipple discharge: Secondary | ICD-10-CM

## 2023-03-07 ENCOUNTER — Ambulatory Visit
Admission: RE | Admit: 2023-03-07 | Discharge: 2023-03-07 | Disposition: A | Discharge status: Home / Self Care | Payer: 59 | Source: Ambulatory Visit | Attending: Obstetrics

## 2023-03-07 ENCOUNTER — Other Ambulatory Visit: Payer: Self-pay | Admitting: Obstetrics

## 2023-03-07 ENCOUNTER — Ambulatory Visit
Admission: RE | Admit: 2023-03-07 | Discharge: 2023-03-07 | Disposition: A | Discharge status: Home / Self Care | Payer: 59 | Source: Ambulatory Visit | Attending: Obstetrics | Admitting: Obstetrics

## 2023-03-07 DIAGNOSIS — N6452 Nipple discharge: Secondary | ICD-10-CM

## 2023-03-09 ENCOUNTER — Other Ambulatory Visit: Payer: Self-pay | Admitting: Obstetrics

## 2023-03-09 DIAGNOSIS — N6452 Nipple discharge: Secondary | ICD-10-CM

## 2023-04-23 ENCOUNTER — Ambulatory Visit
Admission: RE | Admit: 2023-04-23 | Discharge: 2023-04-23 | Disposition: A | Discharge status: Home / Self Care | Payer: 59 | Source: Ambulatory Visit | Attending: Obstetrics | Admitting: Obstetrics

## 2023-04-23 DIAGNOSIS — N6452 Nipple discharge: Secondary | ICD-10-CM

## 2023-04-23 MED ORDER — GADOPICLENOL 0.5 MMOL/ML IV SOLN
6.0000 mL | Freq: Once | INTRAVENOUS | Status: AC | PRN
  Administered 2023-04-23: 6 mL via INTRAVENOUS
